# Patient Record
Sex: Female | Born: 1985 | Race: White | Hispanic: No | Marital: Married | Smoking: Former smoker
Health system: Southern US, Community
[De-identification: ages and names within clinical notes are randomized; demographics above are authoritative.]

## PROBLEM LIST (undated history)

## (undated) DIAGNOSIS — F32A Depression, unspecified: Secondary | ICD-10-CM

## (undated) DIAGNOSIS — G8929 Other chronic pain: Secondary | ICD-10-CM

## (undated) DIAGNOSIS — D649 Anemia, unspecified: Secondary | ICD-10-CM

## (undated) DIAGNOSIS — F329 Major depressive disorder, single episode, unspecified: Secondary | ICD-10-CM

## (undated) DIAGNOSIS — Z720 Tobacco use: Secondary | ICD-10-CM

## (undated) DIAGNOSIS — R102 Pelvic and perineal pain: Secondary | ICD-10-CM

## (undated) DIAGNOSIS — F419 Anxiety disorder, unspecified: Secondary | ICD-10-CM

## (undated) DIAGNOSIS — B009 Herpesviral infection, unspecified: Secondary | ICD-10-CM

## (undated) DIAGNOSIS — T7421XA Adult sexual abuse, confirmed, initial encounter: Secondary | ICD-10-CM

## (undated) DIAGNOSIS — G709 Myoneural disorder, unspecified: Secondary | ICD-10-CM

## (undated) DIAGNOSIS — R51 Headache: Secondary | ICD-10-CM

## (undated) DIAGNOSIS — I10 Essential (primary) hypertension: Secondary | ICD-10-CM

## (undated) HISTORY — DX: Tobacco use: Z72.0

## (undated) HISTORY — DX: Herpesviral infection, unspecified: B00.9

## (undated) HISTORY — DX: Other chronic pain: G89.29

## (undated) HISTORY — DX: Essential (primary) hypertension: I10

## (undated) HISTORY — DX: Pelvic and perineal pain: R10.2

## (undated) HISTORY — DX: Adult sexual abuse, confirmed, initial encounter: T74.21XA

## (undated) HISTORY — DX: Myoneural disorder, unspecified: G70.9

## (undated) HISTORY — PX: ABDOMINAL HYSTERECTOMY: SHX81

---
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
## 2006-02-13 ENCOUNTER — Emergency Department (HOSPITAL_COMMUNITY): Admission: EM | Admit: 2006-02-13 | Discharge: 2006-02-13 | Payer: Self-pay | Admitting: Emergency Medicine

## 2007-09-21 ENCOUNTER — Inpatient Hospital Stay (HOSPITAL_COMMUNITY): Admission: AD | Admit: 2007-09-21 | Discharge: 2007-09-21 | Payer: Self-pay | Admitting: Obstetrics & Gynecology

## 2007-10-26 ENCOUNTER — Ambulatory Visit (HOSPITAL_COMMUNITY): Admission: RE | Admit: 2007-10-26 | Discharge: 2007-10-26 | Payer: Self-pay | Admitting: Obstetrics & Gynecology

## 2007-11-03 ENCOUNTER — Encounter: Payer: Self-pay | Admitting: Obstetrics and Gynecology

## 2007-11-03 ENCOUNTER — Ambulatory Visit: Payer: Self-pay | Admitting: Obstetrics & Gynecology

## 2007-11-04 ENCOUNTER — Ambulatory Visit: Payer: Self-pay | Admitting: Obstetrics & Gynecology

## 2007-11-11 ENCOUNTER — Encounter: Payer: Self-pay | Admitting: Obstetrics & Gynecology

## 2007-11-11 ENCOUNTER — Ambulatory Visit: Payer: Self-pay | Admitting: Obstetrics & Gynecology

## 2007-11-11 ENCOUNTER — Inpatient Hospital Stay (HOSPITAL_COMMUNITY): Admission: RE | Admit: 2007-11-11 | Discharge: 2007-11-13 | Payer: Self-pay | Admitting: Obstetrics & Gynecology

## 2007-11-11 HISTORY — PX: EXPLORATORY LAPAROTOMY: SUR591

## 2007-11-18 ENCOUNTER — Ambulatory Visit: Payer: Self-pay | Admitting: Obstetrics & Gynecology

## 2007-12-09 ENCOUNTER — Ambulatory Visit: Payer: Self-pay | Admitting: Obstetrics & Gynecology

## 2007-12-14 ENCOUNTER — Ambulatory Visit (HOSPITAL_COMMUNITY): Admission: RE | Admit: 2007-12-14 | Discharge: 2007-12-14 | Payer: Self-pay | Admitting: Family Medicine

## 2008-01-13 ENCOUNTER — Ambulatory Visit: Payer: Self-pay | Admitting: Obstetrics & Gynecology

## 2008-02-03 ENCOUNTER — Ambulatory Visit: Payer: Self-pay | Admitting: Obstetrics & Gynecology

## 2008-02-23 ENCOUNTER — Encounter: Payer: Self-pay | Admitting: Obstetrics and Gynecology

## 2008-02-23 ENCOUNTER — Ambulatory Visit: Payer: Self-pay | Admitting: Obstetrics & Gynecology

## 2008-02-23 LAB — CONVERTED CEMR LAB
Antibody Screen: NEGATIVE
HCT: 31.6 % — ABNORMAL LOW (ref 36.0–46.0)
Hemoglobin: 10.2 g/dL — ABNORMAL LOW (ref 12.0–15.0)
RBC: 3.43 M/uL — ABNORMAL LOW (ref 3.87–5.11)

## 2008-02-29 ENCOUNTER — Encounter: Payer: Self-pay | Admitting: Obstetrics & Gynecology

## 2008-03-06 ENCOUNTER — Ambulatory Visit: Payer: Self-pay | Admitting: Obstetrics & Gynecology

## 2008-03-22 ENCOUNTER — Ambulatory Visit: Payer: Self-pay | Admitting: Obstetrics and Gynecology

## 2008-03-23 ENCOUNTER — Ambulatory Visit: Payer: Self-pay | Admitting: Family Medicine

## 2008-04-12 ENCOUNTER — Encounter: Payer: Self-pay | Admitting: Obstetrics and Gynecology

## 2008-04-12 ENCOUNTER — Ambulatory Visit: Payer: Self-pay | Admitting: Obstetrics & Gynecology

## 2008-04-12 LAB — CONVERTED CEMR LAB: GC Probe Amp, Genital: NEGATIVE

## 2008-04-13 ENCOUNTER — Encounter: Payer: Self-pay | Admitting: Obstetrics and Gynecology

## 2008-04-17 ENCOUNTER — Inpatient Hospital Stay (HOSPITAL_COMMUNITY): Admission: AD | Admit: 2008-04-17 | Discharge: 2008-04-17 | Payer: Self-pay | Admitting: Obstetrics & Gynecology

## 2008-04-17 ENCOUNTER — Ambulatory Visit: Payer: Self-pay | Admitting: Obstetrics and Gynecology

## 2008-04-19 ENCOUNTER — Ambulatory Visit (HOSPITAL_COMMUNITY): Admission: RE | Admit: 2008-04-19 | Discharge: 2008-04-19 | Payer: Self-pay | Admitting: Obstetrics & Gynecology

## 2008-04-19 ENCOUNTER — Ambulatory Visit: Payer: Self-pay | Admitting: Family Medicine

## 2008-04-26 ENCOUNTER — Ambulatory Visit: Payer: Self-pay | Admitting: Family Medicine

## 2008-05-03 ENCOUNTER — Ambulatory Visit: Payer: Self-pay | Admitting: Family Medicine

## 2008-05-08 ENCOUNTER — Encounter: Payer: Self-pay | Admitting: Obstetrics and Gynecology

## 2008-05-08 ENCOUNTER — Ambulatory Visit: Payer: Self-pay | Admitting: Family Medicine

## 2008-05-08 LAB — CONVERTED CEMR LAB
ALT: <8 units/L (ref 0–35)
Albumin: 3.4 g/dL — ABNORMAL LOW (ref 3.5–5.2)
CO2: 15 meq/L — ABNORMAL LOW (ref 19–32)
Calcium: 8.9 mg/dL (ref 8.4–10.5)
Chloride: 107 meq/L (ref 96–112)
Creatinine Clearance: 253 mL/min — ABNORMAL HIGH (ref 75–115)
Glucose, Bld: 122 mg/dL — ABNORMAL HIGH (ref 70–99)
Platelets: 337 10*3/uL (ref 150–400)
Potassium: 4.1 meq/L (ref 3.5–5.3)
RBC: 3.77 M/uL — ABNORMAL LOW (ref 3.87–5.11)
Sodium: 138 meq/L (ref 135–145)
Total Protein: 6.4 g/dL (ref 6.0–8.3)
Uric Acid, Serum: 5.7 mg/dL (ref 2.4–7.0)
WBC: 12 10*3/uL — ABNORMAL HIGH (ref 4.0–10.5)

## 2008-05-11 ENCOUNTER — Inpatient Hospital Stay (HOSPITAL_COMMUNITY): Admission: AD | Admit: 2008-05-11 | Discharge: 2008-05-14 | Payer: Self-pay | Admitting: Obstetrics & Gynecology

## 2008-05-11 ENCOUNTER — Ambulatory Visit: Payer: Self-pay | Admitting: Physician Assistant

## 2008-05-16 ENCOUNTER — Encounter: Admission: RE | Admit: 2008-05-16 | Discharge: 2008-06-15 | Payer: Self-pay | Admitting: Obstetrics & Gynecology

## 2008-05-19 ENCOUNTER — Ambulatory Visit: Admission: RE | Admit: 2008-05-19 | Discharge: 2008-05-19 | Payer: Self-pay | Admitting: Obstetrics & Gynecology

## 2008-05-22 ENCOUNTER — Emergency Department (HOSPITAL_COMMUNITY): Admission: EM | Admit: 2008-05-22 | Discharge: 2008-05-22 | Payer: Self-pay | Admitting: Family Medicine

## 2008-06-16 ENCOUNTER — Encounter: Admission: RE | Admit: 2008-06-16 | Discharge: 2008-07-13 | Payer: Self-pay | Admitting: Obstetrics & Gynecology

## 2008-06-16 ENCOUNTER — Inpatient Hospital Stay (HOSPITAL_COMMUNITY): Admission: AD | Admit: 2008-06-16 | Discharge: 2008-06-16 | Payer: Self-pay | Admitting: Obstetrics & Gynecology

## 2008-06-16 ENCOUNTER — Ambulatory Visit: Payer: Self-pay | Admitting: Family Medicine

## 2008-07-07 ENCOUNTER — Inpatient Hospital Stay (HOSPITAL_COMMUNITY): Admission: AD | Admit: 2008-07-07 | Discharge: 2008-07-07 | Payer: Self-pay | Admitting: Obstetrics & Gynecology

## 2008-07-14 ENCOUNTER — Encounter: Admission: RE | Admit: 2008-07-14 | Discharge: 2008-08-13 | Payer: Self-pay | Admitting: Obstetrics & Gynecology

## 2008-08-14 ENCOUNTER — Encounter: Admission: RE | Admit: 2008-08-14 | Discharge: 2008-09-12 | Payer: Self-pay | Admitting: Obstetrics & Gynecology

## 2008-09-13 ENCOUNTER — Encounter: Admission: RE | Admit: 2008-09-13 | Discharge: 2008-10-13 | Payer: Self-pay | Admitting: Obstetrics & Gynecology

## 2008-10-14 ENCOUNTER — Encounter: Admission: RE | Admit: 2008-10-14 | Discharge: 2008-11-12 | Payer: Self-pay | Admitting: Obstetrics & Gynecology

## 2008-11-13 ENCOUNTER — Encounter: Admission: RE | Admit: 2008-11-13 | Discharge: 2008-12-13 | Payer: Self-pay | Admitting: Obstetrics & Gynecology

## 2008-12-30 ENCOUNTER — Inpatient Hospital Stay (HOSPITAL_COMMUNITY): Admission: AD | Admit: 2008-12-30 | Discharge: 2008-12-30 | Payer: Self-pay | Admitting: Obstetrics & Gynecology

## 2009-03-19 ENCOUNTER — Inpatient Hospital Stay (HOSPITAL_COMMUNITY): Admission: AD | Admit: 2009-03-19 | Discharge: 2009-03-19 | Payer: Self-pay | Admitting: Obstetrics & Gynecology

## 2009-12-19 ENCOUNTER — Ambulatory Visit: Payer: Self-pay | Admitting: Nurse Practitioner

## 2009-12-19 ENCOUNTER — Inpatient Hospital Stay (HOSPITAL_COMMUNITY): Admission: AD | Admit: 2009-12-19 | Discharge: 2009-12-19 | Payer: Self-pay | Admitting: Obstetrics & Gynecology

## 2009-12-26 ENCOUNTER — Ambulatory Visit: Payer: Self-pay | Admitting: Nurse Practitioner

## 2009-12-26 ENCOUNTER — Inpatient Hospital Stay (HOSPITAL_COMMUNITY): Admission: AD | Admit: 2009-12-26 | Discharge: 2009-12-26 | Payer: Self-pay | Admitting: Obstetrics & Gynecology

## 2010-01-02 ENCOUNTER — Ambulatory Visit: Payer: Self-pay | Admitting: Obstetrics & Gynecology

## 2010-01-15 ENCOUNTER — Telehealth: Payer: Self-pay | Admitting: Family Medicine

## 2010-01-15 ENCOUNTER — Telehealth: Payer: Self-pay | Admitting: *Deleted

## 2010-01-30 ENCOUNTER — Encounter: Payer: Self-pay | Admitting: *Deleted

## 2010-01-30 ENCOUNTER — Ambulatory Visit: Payer: Self-pay | Admitting: Family Medicine

## 2010-01-30 ENCOUNTER — Encounter: Payer: Self-pay | Admitting: Family Medicine

## 2010-01-30 DIAGNOSIS — F4001 Agoraphobia with panic disorder: Secondary | ICD-10-CM | POA: Insufficient documentation

## 2010-01-30 DIAGNOSIS — N926 Irregular menstruation, unspecified: Secondary | ICD-10-CM | POA: Insufficient documentation

## 2010-01-31 ENCOUNTER — Encounter: Payer: Self-pay | Admitting: Family Medicine

## 2010-01-31 ENCOUNTER — Emergency Department (HOSPITAL_COMMUNITY): Admission: EM | Admit: 2010-01-31 | Discharge: 2010-01-31 | Payer: Self-pay | Admitting: Family Medicine

## 2010-01-31 ENCOUNTER — Telehealth: Payer: Self-pay | Admitting: Family Medicine

## 2010-02-04 ENCOUNTER — Encounter: Payer: Self-pay | Admitting: Family Medicine

## 2010-02-04 ENCOUNTER — Ambulatory Visit (HOSPITAL_COMMUNITY): Admission: RE | Admit: 2010-02-04 | Discharge: 2010-02-04 | Payer: Self-pay | Admitting: *Deleted

## 2010-02-04 ENCOUNTER — Ambulatory Visit: Payer: Self-pay | Admitting: Family Medicine

## 2010-02-04 ENCOUNTER — Telehealth: Payer: Self-pay | Admitting: Family Medicine

## 2010-02-04 LAB — CONVERTED CEMR LAB
AST: 13 units/L (ref 0–37)
BUN: 9 mg/dL (ref 6–23)
Bilirubin Urine: NEGATIVE
CO2: 24 meq/L (ref 19–32)
Calcium: 9.9 mg/dL (ref 8.4–10.5)
Chloride: 107 meq/L (ref 96–112)
Creatinine, Ser: 0.71 mg/dL (ref 0.40–1.20)
Eosinophils Relative: 2 % (ref 0–5)
HCT: 35.9 % — ABNORMAL LOW (ref 36.0–46.0)
Hemoglobin: 11.6 g/dL — ABNORMAL LOW (ref 12.0–15.0)
Ketones, urine, test strip: NEGATIVE
Lymphocytes Relative: 35 % (ref 12–46)
Lymphs Abs: 2.7 10*3/uL (ref 0.7–4.0)
Monocytes Absolute: 0.3 10*3/uL (ref 0.1–1.0)
Protein, U semiquant: NEGATIVE
Urobilinogen, UA: 0.2
WBC: 7.5 10*3/uL (ref 4.0–10.5)

## 2010-02-11 ENCOUNTER — Ambulatory Visit: Payer: Self-pay | Admitting: Family Medicine

## 2010-02-11 ENCOUNTER — Encounter: Payer: Self-pay | Admitting: *Deleted

## 2010-02-11 ENCOUNTER — Telehealth: Payer: Self-pay | Admitting: *Deleted

## 2010-02-13 ENCOUNTER — Encounter: Payer: Self-pay | Admitting: Family Medicine

## 2010-02-13 ENCOUNTER — Ambulatory Visit: Payer: Self-pay | Admitting: Family Medicine

## 2010-02-13 DIAGNOSIS — F321 Major depressive disorder, single episode, moderate: Secondary | ICD-10-CM | POA: Insufficient documentation

## 2010-02-25 ENCOUNTER — Telehealth: Payer: Self-pay | Admitting: Family Medicine

## 2010-02-28 ENCOUNTER — Emergency Department (HOSPITAL_COMMUNITY): Admission: EM | Admit: 2010-02-28 | Discharge: 2010-02-28 | Payer: Self-pay | Admitting: Emergency Medicine

## 2010-03-02 ENCOUNTER — Ambulatory Visit: Payer: Self-pay | Admitting: Nurse Practitioner

## 2010-03-02 ENCOUNTER — Inpatient Hospital Stay (HOSPITAL_COMMUNITY): Admission: AD | Admit: 2010-03-02 | Discharge: 2010-03-03 | Payer: Self-pay | Admitting: Family Medicine

## 2010-03-06 ENCOUNTER — Telehealth: Payer: Self-pay | Admitting: Family Medicine

## 2010-03-07 ENCOUNTER — Ambulatory Visit: Payer: Self-pay | Admitting: Family Medicine

## 2010-03-07 ENCOUNTER — Encounter: Payer: Self-pay | Admitting: Family Medicine

## 2010-03-19 ENCOUNTER — Telehealth: Payer: Self-pay | Admitting: Family Medicine

## 2010-03-20 ENCOUNTER — Telehealth: Payer: Self-pay | Admitting: Family Medicine

## 2010-03-20 ENCOUNTER — Inpatient Hospital Stay (HOSPITAL_COMMUNITY)
Admission: AD | Admit: 2010-03-20 | Discharge: 2010-03-20 | Payer: Self-pay | Source: Home / Self Care | Admitting: Family Medicine

## 2010-03-21 ENCOUNTER — Encounter: Payer: Self-pay | Admitting: *Deleted

## 2010-03-28 ENCOUNTER — Ambulatory Visit: Payer: Self-pay | Admitting: Family Medicine

## 2010-03-28 DIAGNOSIS — I10 Essential (primary) hypertension: Secondary | ICD-10-CM | POA: Insufficient documentation

## 2010-04-02 ENCOUNTER — Encounter: Payer: Self-pay | Admitting: Family Medicine

## 2010-04-03 ENCOUNTER — Ambulatory Visit: Payer: Self-pay | Admitting: Obstetrics and Gynecology

## 2010-04-07 ENCOUNTER — Emergency Department (HOSPITAL_BASED_OUTPATIENT_CLINIC_OR_DEPARTMENT_OTHER)
Admission: EM | Admit: 2010-04-07 | Discharge: 2010-04-07 | Payer: Self-pay | Source: Home / Self Care | Admitting: Emergency Medicine

## 2010-04-08 ENCOUNTER — Emergency Department (HOSPITAL_BASED_OUTPATIENT_CLINIC_OR_DEPARTMENT_OTHER)
Admission: EM | Admit: 2010-04-08 | Discharge: 2010-04-08 | Payer: Self-pay | Source: Home / Self Care | Admitting: Emergency Medicine

## 2010-04-16 ENCOUNTER — Telehealth: Payer: Self-pay | Admitting: Family Medicine

## 2010-04-18 ENCOUNTER — Encounter: Payer: Self-pay | Admitting: *Deleted

## 2010-04-18 ENCOUNTER — Ambulatory Visit: Payer: Self-pay | Admitting: Obstetrics and Gynecology

## 2010-04-18 ENCOUNTER — Ambulatory Visit: Payer: Self-pay | Admitting: Family Medicine

## 2010-04-24 ENCOUNTER — Emergency Department (HOSPITAL_COMMUNITY)
Admission: EM | Admit: 2010-04-24 | Discharge: 2010-04-25 | Payer: Self-pay | Source: Home / Self Care | Admitting: Emergency Medicine

## 2010-04-24 LAB — URINALYSIS, ROUTINE W REFLEX MICROSCOPIC
Bilirubin Urine: NEGATIVE
Hemoglobin, Urine: NEGATIVE
Ketones, ur: NEGATIVE mg/dL
Nitrite: NEGATIVE
Protein, ur: NEGATIVE mg/dL
Specific Gravity, Urine: 1.028 (ref 1.005–1.030)
Urine Glucose, Fasting: NEGATIVE mg/dL
Urobilinogen, UA: 0.2 mg/dL (ref 0.0–1.0)
pH: 6.5 (ref 5.0–8.0)

## 2010-04-24 LAB — POCT PREGNANCY, URINE: Preg Test, Ur: NEGATIVE

## 2010-05-01 ENCOUNTER — Ambulatory Visit (HOSPITAL_COMMUNITY)
Admission: RE | Admit: 2010-05-01 | Discharge: 2010-05-01 | Payer: Self-pay | Source: Home / Self Care | Attending: Family Medicine | Admitting: Family Medicine

## 2010-05-02 ENCOUNTER — Ambulatory Visit
Admission: RE | Admit: 2010-05-02 | Discharge: 2010-05-02 | Payer: Self-pay | Source: Home / Self Care | Attending: Obstetrics and Gynecology | Admitting: Obstetrics and Gynecology

## 2010-05-07 ENCOUNTER — Telehealth: Payer: Self-pay | Admitting: Family Medicine

## 2010-05-09 ENCOUNTER — Emergency Department (HOSPITAL_BASED_OUTPATIENT_CLINIC_OR_DEPARTMENT_OTHER)
Admission: EM | Admit: 2010-05-09 | Discharge: 2010-05-09 | Payer: Self-pay | Source: Home / Self Care | Admitting: Emergency Medicine

## 2010-05-12 ENCOUNTER — Encounter: Payer: Self-pay | Admitting: Family Medicine

## 2010-05-12 ENCOUNTER — Encounter: Payer: Self-pay | Admitting: *Deleted

## 2010-05-13 LAB — URINALYSIS, ROUTINE W REFLEX MICROSCOPIC
Bilirubin Urine: NEGATIVE
Hgb urine dipstick: NEGATIVE
Ketones, ur: NEGATIVE mg/dL
Nitrite: NEGATIVE
Protein, ur: 30 mg/dL — AB
Specific Gravity, Urine: 1.024 (ref 1.005–1.030)
Urine Glucose, Fasting: NEGATIVE mg/dL
Urobilinogen, UA: 1 mg/dL (ref 0.0–1.0)
pH: 7.5 (ref 5.0–8.0)

## 2010-05-13 LAB — URINE CULTURE
Colony Count: 2000
Culture  Setup Time: 201201192205

## 2010-05-13 LAB — PREGNANCY, URINE: Preg Test, Ur: NEGATIVE

## 2010-05-13 LAB — URINE MICROSCOPIC-ADD ON

## 2010-05-14 LAB — CBC
HCT: 39.9 % (ref 36.0–46.0)
Hemoglobin: 12.8 g/dL (ref 12.0–15.0)
MCH: 26.3 pg (ref 26.0–34.0)
MCHC: 32.1 g/dL (ref 30.0–36.0)
MCV: 82.1 fL (ref 78.0–100.0)
Platelets: 371 10*3/uL (ref 150–400)
RBC: 4.86 MIL/uL (ref 3.87–5.11)
RDW: 15.5 % (ref 11.5–15.5)
WBC: 10.3 10*3/uL (ref 4.0–10.5)

## 2010-05-14 LAB — BASIC METABOLIC PANEL
BUN: 6 mg/dL (ref 6–23)
CO2: 25 mEq/L (ref 19–32)
Calcium: 9.7 mg/dL (ref 8.4–10.5)
Chloride: 102 mEq/L (ref 96–112)
Creatinine, Ser: 0.63 mg/dL (ref 0.4–1.2)
GFR calc Af Amer: >60 mL/min (ref >60)
GFR calc non Af Amer: >60 mL/min (ref >60)
Glucose, Bld: 99 mg/dL (ref 70–99)
Potassium: 3.6 mEq/L (ref 3.5–5.1)
Sodium: 135 mEq/L (ref 135–145)

## 2010-05-14 LAB — SURGICAL PCR SCREEN
MRSA, PCR: NEGATIVE
Staphylococcus aureus: NEGATIVE

## 2010-05-15 ENCOUNTER — Inpatient Hospital Stay (HOSPITAL_COMMUNITY)
Admission: RE | Admit: 2010-05-15 | Discharge: 2010-05-17 | Payer: Self-pay | Source: Home / Self Care | Attending: Obstetrics & Gynecology | Admitting: Obstetrics & Gynecology

## 2010-05-15 LAB — TYPE AND SCREEN: ABO/RH(D): A NEG

## 2010-05-15 LAB — PREGNANCY, URINE: Preg Test, Ur: NEGATIVE

## 2010-05-16 HISTORY — PX: DIAGNOSTIC LAPAROSCOPY: SUR761

## 2010-05-16 LAB — CBC
HCT: 31.9 % — ABNORMAL LOW (ref 36.0–46.0)
MCV: 82 fL (ref 78.0–100.0)
Platelets: 316 10*3/uL (ref 150–400)
RBC: 3.89 MIL/uL (ref 3.87–5.11)
RDW: 15.4 % (ref 11.5–15.5)
WBC: 13.5 10*3/uL — ABNORMAL HIGH (ref 4.0–10.5)

## 2010-05-20 NOTE — Discharge Summary (Signed)
NAMEJIA, DOTTAVIO              ACCOUNT NO.:  [id number]  MEDICAL RECORD NO.:  [id number]          PATIENT TYPE:  INP  LOCATION:  9303                          FACILITY:  [facility]  PHYSICIAN:  Horton Chin, MD DATE OF BIRTH:  10-27-1985  DATE OF ADMISSION:  05/15/2010 DATE OF DISCHARGE:  05/17/2010                              DISCHARGE SUMMARY   ADMISSION DIAGNOSES:  Chronic pelvic pain, persistent right ovarian cyst, endometriosis.  DISCHARGE DIAGNOSES:  Chronic pelvic pain, persistent right ovarian cyst, endometriosis.  PROCEDURES:  Status post exploratory laparotomy, right oophorectomy, and lysis of adhesions for endometriosis.  PERTINENT STUDIES:  Preoperative hemoglobin of 12.8, postoperative hemoglobin of 10.4.  Pathology consistent with endometriosis (endometriotic cyst of the ovary).  BRIEF HOSPITAL COURSE:  The patient is a 25 year old gravida 1, para 1, with a long history of chronic pelvic pain.  In 2009, patient underwent removal of a 9-cm endometrioma during pregnancy and required an exploratory laparotomy for this.  Since that surgery, patient has had persistent recurrence of endometrioma on the right side causing significant pain and requiring significant narcotic use.  The patient desired definitive surgery for her right ovary.  Prior to surgery, she was also counseled that she will need Depo Lupron for treatment which she has never tried.  She is currently on Sprintec which is not helping her symptoms much.  The patient underwent an uncomplicated surgery.  For further details of this operation, please refer to separate dictated operative report.  Postoperatively, pain control was a problem and patient required escalating doses of narcotics and did not seem to get comfortable initially, but after she was transitioned to oral pain meds, she was stable.  By postoperative day #2, she was ambulating, voiding without difficulty, tolerating regular diet, and  passing flatus.  She was deemed stable for discharge to home.  DISCHARGE MEDICATIONS: 1. Percocet 10 /325 one to two tablets p.o. q.4-6 hours p.r.n. severe     pain. 2. Ibuprofen 600 mg p.o. q.6 hours p.r.n. pain. 3. Colace 100 mg p.o. b.i.d. p.r.n. constipation.  DISCHARGE INSTRUCTIONS:  The patient was given routine discharge instructions for postoperative patients and told to avoid heavy lifting or sitting in bath of water.  She was told to keep the incision clean and dry and to come to the MAU for any concerns.  Her followup appointment has been scheduled in the [facility] on June 21, 2010, at 9:30 a.m., and this was communicated to the patient.  The patient does say that she might be traveling to [place] within the next two weeks to be with her family.  It was emphasized that  prolonged travel was not recommended.  However, if she made the decision  to travel, she should take frequent breaks for leg exercises as  she is at high risk for deep venous thrombosis or pulmonary embolism  given that she just had pelvic surgery. She was told that if she has  any problems, to go to the nearest emergency room for evaluation.     Horton Chin, MD     UAA/MEDQ  D:  05/17/2010  T:  05/18/2010  Job:  [id number]  Electronically Signed by Jaynie Collins MD on 05/20/2010 06:20:33 PM

## 2010-05-20 NOTE — Op Note (Signed)
NAMELUCIANNE, Mcdaniel              ACCOUNT NO.:  [id number]  MEDICAL RECORD NO.:  [id number]          PATIENT TYPE:  INP  LOCATION:  9303                          [facility]  PHYSICIAN:  Horton Chin, MD DATE OF BIRTH:  [birth year]  DATE OF PROCEDURE:  05/15/2010 DATE OF DISCHARGE:                              OPERATIVE REPORT   PREOPERATIVE DIAGNOSES:  Chronic pelvic pain, persistent right ovarian cyst.  POSTOPERATIVE DIAGNOSES:  Chronic pelvic pain, persistent right ovarian cyst and extensive endometriosis.  PROCEDURES:  Diagnostic laparoscopy, conversion to exploratory laparotomy, right oophorectomy, lysis of adhesions and fulguration of endometriosis.  SURGEON:  Horton Chin, MD  ANESTHESIA:  General.  ANESTHESIOLOGIST:  Brayton Caves, MD  IV FLUIDS:  2200 mL of lactated Ringer's.  ESTIMATED BLOOD LOSS:  100 mL.  URINE OUTPUT:  150 mL.  INDICATIONS:  The patient is a 25 year old, gravida 1, para 1, with a history of undergoing exploratory laparotomy during pregnancy for a large right endometrioma in 2009.  Since then, the patient has had significant pelvic pain and persistent right-sided pain.  The patient is also requiring large doses of narcotics to help manage her pain.  At her last encounter in the office, she was counseled regarding management and the patient has been taking Sprintec continually, but she said that this does not help with her pain.  The patient wanted surgical management and removal of the right ovary.  On evaluation of her last operative note, it was noted that her right ovary was pretty adherent to her pelvic sidewall and posterior uterus, but the patient was consented for diagnostic laparoscopy, possible laparotomy and also removal of her right ovary and fulguration of endometriosis.  Prior to surgery, the risks of the surgery were reviewed with the patient including, but not limited to: bleeding, infection, injury to  surrounding organs, need for additional procedures, possible conversion to laparotomy and written informed consent was obtained.  Of note, the patient was told that if there was endometriosis that the definitive surgery was a total abdominal hysterectomy and bilateral salpingo-oophorectomy.  However, the patient desires future fertility and did not want definitive surgical management as of now.  FINDINGS:  Dense adhesions of her right ovary to her pelvic sidewall and posterior uterus.  The patient has a small uterus.  There was also dense adhesions in the posterior cul-de-sac.  Normal left adnexa.  She did have one lesion of endometriosis that was seen in the anterior cul-de- sac.  SPECIMENS:  Right ovary.  DISPOSITION OF SPECIMEN:  Pathology.  COMPLICATIONS:  None immediate.  PROCEDURE DETAILS:  The patient received preoperative antibiotics and had sequential compression devices applied to her lower extremities. While in the preoperative area, she was then taken to the operating room where general analgesia was administered and found to be adequate.  The patient was then placed in the dorsal lithotomy position, and prepped and draped in a sterile manner.  A Foley catheter was inserted in the patient's bladder and attached to constant gravity.  A uterine manipulator was also placed at this point.  After an adequate time-out was performed, attention was turned  to the patient's umbilicus where an incision was made and the Veress needle was introduced into the peritoneal cavity and used for insufflation of the peritoneal cavity with carbon dioxide gas.  This was done up to 15 mmHg.  The Veress needle was removed and 10-mm trocar was then placed.  Through this trocar, laparoscope was placed and used to survey the abdomen and pelvis.  It was clearly noted that the right ovary was densely adherent to the pelvic sidewall.  An attempt was made to loosen the adhesions in a blunt fashion  using the blunt probe, but this was not possible and there was also significant adhesions seen in the posterior cul-de-sac involving the uterus and some part of the intestines.  Given the difficulty with doing this laparoscopically, decision was made to abort the procedure and convert to the laparotomy.  The abdomen was desufflated.  All instruments were removed and the umbilical incision was repaired with 0 Vicryl for the fascia and 4-0 Vicryl subcuticular stitch for the skin incision.  Prior to the conversion to laparotomy,  the patient was taken out of dorsal lithotomy position and placed in  the dorsal supine position. Attention was then turned to the patient's  lower abdomen, where an incision was made over her preexisting scar and taken down to the layer of fascia.  The fascia was incised in the midline and extended bilaterally using scissors.  Kochers were applied to both aspects of the fascial incision and the underlying  rectus muscles were dissected off bluntly and sharply.  The patient was noted to have a separation of her rectus muscles and so access was obtained into the peritoneal cavity.  The peritoneum was incised superiorly and inferiorly with good visualization of bowel and bladder.  At this point, the bowel was packed away with laparotomy sponges.  After the bowel was packed away, attention was then turned  to the patient's right adnexal region, where blunt dissection, and sharp dissection was used to free the ovary from the pelvic sidewall and the posterior uterus.  At this point, the large cyst that was noted was ruptured for dark chocolate fluid that was copious and the infundibulopelvic ligament was identified, clamped, cut and suture ligated.  The ovary was freed from the fallopian tube using coagulation and the gyrus instrument and then the utero-ovarian ligament was able to be clamped, cut and suture ligated allowing for a right oophorectomy. There was some  bleeders especially around the base of the adhesions that were coagulated and some endometriosis lesions that were seenin the posterior cul-de-sac that were fulgurated.  The rest of adhesions to the bowel was carefully lysed and also endometriosis lesions in the anterior cul-de-sac were also fulgurated.  The abdomen and pelvis was copiously irrigated and then because there was this punctate bleeding from the base of the adhesions, Surgicel was placed around this friable area.   All the instruments were then removed from the patient's pelvis.   The peritoneum and muscle layer were closed in a mass fashion using a 0 Vicryl running stitch.  The fascia was closed using looped 0 PDS.  The subcutaneous layer was irrigated.  Local analgesia was administered in the form of 0.25% Marcaine, total of 30 mL was used and then the incision was closed with 3-0 Monocryl subcuticular stitch.  The patient tolerated the procedure well.  Sponge, instrument and needle  counts were correct x2.  She was taken to recovery room in stable  condition.  Of note, the  patient will be counseled regarding probably  being on Depo-Lupron before she tries to get pregnant again and the  results of the pathology will be followed up and managed accordingly.     Horton Chin, MD     UAA/MEDQ  D:  05/15/2010  T:  05/16/2010  Job:  [id number]  Electronically Signed by Jaynie Collins MD on 05/20/2010 06:17:40 PM

## 2010-05-21 NOTE — Letter (Signed)
Summary: Work Excuse  [facility]  [address]   [place], [place] [address]   Phone: [phone]  Fax: [phone]    Today's Date: January 30, 2010  Name of Patient: Rachel Mcdaniel  The above named patient had a medical visit today at: 3:45 pm.  Please take this into consideration when reviewing the time away from work.    Special Instructions:  [ x ] None  [  ] To be off the remainder of today, returning to the normal work / school schedule tomorrow.  [  ] To be off until the next scheduled appointment on ______________________.  [  ] Other ________________________________________________________________ ________________________________________________________________________   Sincerely yours,   Loralee Pacas CMA

## 2010-05-21 NOTE — Assessment & Plan Note (Signed)
Summary: remove iud/eo   Vital Signs:  Patient profile:   25 year old female Height:      64.5 inches Weight:      154 pounds BMI:     26.12 Temp:     98.3 degrees F oral Pulse rate:   69 / minute BP sitting:   140 / 94  (left arm) Cuff size:   regular  Vitals Entered By: Jimmy Footman, CMA (February 13, 2010 2:51 PM) CC: iud removal Is Patient Diabetic? No Pain Assessment Patient in pain? no        Primary Care Provider:  Clementeen Graham MD  CC:  iud removal.  History of Present Illness: Pt returns to clinic today to follow up her abdominal pain and to remove IUD.   Abdominal pain:  Conmtinues to be chronic. Taking occasional vicoprofen for pain. Was nauseated with vicodin and  threw them away. Trigger point injection helped some but has rebound pain following injection.  Unable to fill zofran or phenergan due to costs at [facility].   Bleeding: Irrgular vaginal bleeding. Has been continuous for 4 weeks. Has copper IUD in place.  OK with taking OCPs and using condoms for birth control.   Mood: Has started taking celexa. Feels somewhat improved. PHQ9 today is 12. Will scan in. No SI/HI. Also feels anxiety component to mood.   Habits & Providers  Alcohol-Tobacco-Diet     Alcohol drinks/day: 0     Tobacco Status: quit  Exercise-Depression-Behavior     Have you felt down or hopeless? yes     Have you felt little pleasure in things? 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 1 By Mouth Daily 2)  Zofran 4 Mg Tabs (Ondansetron Hcl) .Marland Kitchen.. 1 Tablet Every 6-8 Hours As Needed For Nausea 3)  Sprintec 28 0.25-35 Mg-Mcg Tabs (Norgestimate-Eth Estradiol) .Marland Kitchen.. 1 Tab By Mouth Daily As Instructed On Pack 4)   Promethazine Hcl 12.5 Mg Tabs (Promethazine Hcl) .Marland Kitchen.. 1-2 Tabs  Every 4-6 Hours As Needed For Nausea 5)  Hydrocodone-Acetaminophen 5-500 Mg Tabs (Hydrocodone-Acetaminophen) .... 1/2 To 1 6 Hours As Needed For Pain  Allergies (verified): No Known Drug Allergies  Past History:  Past Surgical History: Last updated: 01/30/2010 Hx ovarian cyst removed 9cm via open procedure.  G1P1  Past Medical History: Panic Disorder vs depression.  Abdominal Pain Menometrorrhagia.  Social History: Reviewed history from 01/30/2010 and no changes required. Lives in [place] with her daughter and husband.  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  Review of Systems       The patient complains of abdominal pain.  The patient denies anorexia, fever, weight loss, chest pain, syncope, dyspnea on exertion, melena, hematochezia, severe indigestion/heartburn, genital sores, suspicious skin lesions, difficulty walking, enlarged lymph nodes, and breast masses.    Physical Exam  General:  VS noted.  Well woman in NAD Head:  normocephalic and atraumatic.   Eyes:  vision grossly intact.   Ears:  no external deformities.   Nose:  no external deformity.   Mouth:  good dentition.   Lungs:  normal respiratory effort.   Heart:  normal rate, regular rhythm, and no murmur.   Abdomen:  + tenderness to palpation in RLQ, minimal rebound/guarding,  +  bowel sounds no distention, no masses, no rigidity, no abdominal hernia, no inguinal hernia, no hepatomegaly, and no splenomegaly.   Genitalia:  Normal introitus for age, no external lesions, no vaginal discharge, mucosa pink and moist, no vaginal or cervical lesions, no vaginal atrophy, no friaility or hemorrhage, normal uterus size and position, no adnexal masses or tenderness. IUD strings noted in external oss.   Extremities:  2+ peripheral pulses Cervical Nodes:  No lymphadenopathy noted Axillary Nodes:  No palpable lymphadenopathy Psych:  Oriented X3, memory  intact for recent and remote, normally interactive, good eye contact, not depressed appearing, not agitated, not suicidal, not homicidal, and slightly anxious.   Additional Exam:  Procedure note: IUD removal. Concent obtained. Timeout performed.  Specelum placed and cervix visualized with strings emmerging.  Cervix swabbed x3 with betadine. Using sterile ringed forceps and sterile technique an intact copper IUD was removed from the uterus without difficulty and complications. No bleeeding. Pt tolerated the procedure well.    Impression & Recommendations:  Problem # 1:  ABDOMINAL PAIN, RIGHT LOWER QUADRANT (ICD-789.03) Assessment Improved  This is becoming a chronic pain issue with a poor physiologic explination. Possible explination is hemmorhagic  or endometroid cyst vs abdominal wall pain.  However IUD may be cause of some crampy pain. Plan: Remove IUD today, and provide OCPs for cyst control and bleeding.  Also refil x1 vicodin. Explained to patient that if she is intolerant to a pain medication to bring it in and show to nurses to confirm pill count.  Goal is for me no to be providing long term pain control.   Orders: [facility]- Est  Level 4 (14782) IUD removal -[facility] (95621)  Problem # 2:  IRREGULAR MENSTRUAL CYCLE (ICD-626.4) Assessment: New  See above.  Her updated medication list for this problem includes:    Sprintec 28 0.25-35 Mg-mcg Tabs (Norgestimate-eth estradiol) .Marland Kitchen... 1 tab by mouth daily as instructed on pack  Orders: [facility]- Est  Level 4 (30865) IUD removal -[facility] (78469)  Problem # 3:  DEPRESSION, MAJOR, MODERATE (ICD-296.22)  On celexa 10mg . Tolerating well. Plan to follow up in 1 month to reasses.   Orders: [facility]- Est  Level 4 (99214)  Problem # 4:  PANIC DISORDER WITH AGORAPHOBIA (ICD-300.21) Same as above.   Complete Medication List: 1)  Citalopram Hydrobromide 10 Mg Tabs (Citalopram hydrobromide) .Marland Kitchen.. 1 by mouth daily 2)  Zofran 4 Mg Tabs (Ondansetron hcl) .Marland Kitchen.. 1  tablet every 6-8 hours as needed for nausea 3)  Sprintec 28 0.25-35 Mg-mcg Tabs (Norgestimate-eth estradiol) .Marland Kitchen.. 1 tab by mouth daily as instructed on pack 4)  Promethazine Hcl 12.5 Mg Tabs (Promethazine hcl) .Marland Kitchen.. 1-2 tabs  every 4-6 hours as needed for nausea 5)  Hydrocodone-acetaminophen 5-500 Mg Tabs (Hydrocodone-acetaminophen) .... 1/2 to 1 6 hours as needed for pain  Patient Instructions: 1)  Thank you for seeing me today. 2)  Try the celexa in the morning. 3)  Use condoms with sex. 4)  You will have some bleeding following IUD removal.  5)  Sprintec is a birth control pill. It should control bleeding and control cysts.  6)  Come back in 1 month.  7)  1/2 vicodin tablet for pain will cause less nausea.  8)  You can try the sprintec for 3 months in a row then take the placebo part of it to have a peroid.  Prescriptions: HYDROCODONE-ACETAMINOPHEN 5-500 MG TABS (HYDROCODONE-ACETAMINOPHEN) 1/2 to 1 6 hours as needed for pain  #30 x 0  Entered and Authorized by:   Clementeen Graham MD   Signed by:   Clementeen Graham MD on 02/13/2010   Method used:   Print then Give to Patient   RxID:   [id number] SPRINTEC 28 0.25-35 MG-MCG TABS (NORGESTIMATE-ETH ESTRADIOL) 1 tab by mouth daily as instructed on pack  #30 x 11   Entered and Authorized by:   Clementeen Graham MD   Signed by:   Clementeen Graham MD on 02/13/2010   Method used:   Electronically to        [facility] [address] (retail)       [phone]       [place], [place]  [address]       Ph: [phone]       Fax: [phone]   RxID:   [id number] PROMETHAZINE HCL 12.5 MG TABS (PROMETHAZINE HCL) 1-2 tabs  every 4-6 hours as needed for nausea  #30 x 0   Entered and Authorized by:   Clementeen Graham MD   Signed by:   Clementeen Graham MD on 02/13/2010   Method used:   Electronically to        [facility] [address] (retail)       [phone]       [place], [place]  [address]       Ph:  [phone]       Fax: [phone]   RxID:   [id number] ZOFRAN 4 MG TABS (ONDANSETRON HCL) 1 tablet every 6-8 hours as needed for nausea  #30 x 0   Entered and Authorized by:   Clementeen Graham MD   Signed by:   Clementeen Graham MD on 02/13/2010   Method used:   Electronically to        [facility] [address] (retail)       [phone]       [place], [place]  [address]       Ph: [phone]       Fax: [phone]   RxID:   [id number]    Orders Added: 1)  [facility]- Est  Level 4 [93818] 2)  IUD removal -[facility] [[id number]]

## 2010-05-21 NOTE — Progress Notes (Signed)
Summary: phn msg  Phone Note Call from Patient Call back at [facility] Phone [phone]   Caller: Patient Summary of Call: pt is needing to talk to Dr Denyse Amass Initial call taken by: De Nurse,  February 25, 2010 1:39 PM  Follow-up for Phone Call        Called and left a message.  Pt needs MD visit for refils on this. Said as much.  Follow-up by: Clementeen Graham MD,  February 25, 2010 2:25 PM

## 2010-05-21 NOTE — Progress Notes (Signed)
Summary: triage  Phone Note Call from Patient Call back at Home Phone [phone]   Caller: Patient Summary of Call: Pt having severe abd pain. Initial call taken by: Clydell Hakim,  January 15, 2010 4:31 PM  Follow-up for Phone Call        states she is bleeding very badly-a tampon every 20 minutes since last night.  pain is 9/10.  taking naproxyn & megace.  has no insurance. told her anywhre she goes will want at least $25, including [place]. she was on her feet 12 hours at work yesterday & has to go in at 5 today.  I advised her to stay home & off feet & go to [facility] for the note she needs for work. just wants a rx. told her since it has been years since she has been here, she will need to apply to be a new pt. states she has an appt in later Oct. told her she will have to use UC until then Follow-up by: Golden Circle RN,  January 15, 2010 4:39 PM

## 2010-05-21 NOTE — Progress Notes (Signed)
Summary: meds  Phone Note Call from Patient Call back at Home Phone [phone]   Caller: Patient Summary of Call: pt is hurting really bad and wants to know if she can get pain meds until appt w/ Denyse Amass [facility] [facility]  Initial call taken by: De Nurse,  February 04, 2010 12:01 PM  Follow-up for Phone Call        called pt back and was given appt for this afternoon Follow-up by: De Nurse,  February 04, 2010 12:13 PM

## 2010-05-21 NOTE — Miscellaneous (Signed)
Summary: reax to shot per pt  Clinical Lists Changes pt called crying & in pain. states the shot she got yesterday has made her legs feel very heavy & she has a HA. taking naproxyn which is not helping. referred her to UC as we have no appts. I tected md to call me. he states he gave her a trigger point injection in pelvic area by c section scar.it was Lidocaine in muscle. 5/8 inch needle. states she has some anxiety issues. he got her number & stated he was going to call her now.Golden Circle RN  January 31, 2010 12:00 PM  md called back & sent her to UC. she is also vomiting.Golden Circle RN  January 31, 2010 12:02 PM  MD Note:  Also pt was started on Celexa yesterday. The GI effects and headache may be due to this medication as well. Called patient and left a message to stop this medication and follow up at PCP office soon.  Pt asked to go to urgent care for exam to evaluate for peritinitis. Feel this is not likely but warents speedy evalauation.   Clementeen Graham, MD [phone]

## 2010-05-21 NOTE — Assessment & Plan Note (Signed)
Summary: np,df   Vital Signs:  Patient profile:   25 year old female Height:      64.5 inches Weight:      157 pounds BMI:     26.63 Temp:     98.2 degrees F oral BP sitting:   136 / 68  (left arm) Cuff size:   regular  Vitals Entered By: Tessie Fass CMA (January 30, 2010 4:10 PM) CC: NEW PT Is Patient Diabetic? No Pain Assessment Patient in pain? yes     Location: abdomen Intensity: 8   CC:  NEW PT.  History of Present Illness: Ms Mennella presents to clinic today to establish care and to follow up abominal pain and anxiety.  Abdominal pain.  For around 2 years. Had pain following a laporotomy for cyst removal during pregnancy.  Continues to have LRQ abdominal pain esecially with lifting and sitting up.  This issue has been worked up at [facility] clinic.  She has had a subesquent normal pelvic ultrasound in the interum. She does have a copper IUD and does have irregular bleeding. She is planning on removing the IUD. Bleeding now x4 weeks. Passing some blood clots. Cramping. No GI symptoms.  Anxiety: For years. Has panic attacks several times a month. Can last for a few minutes. Several years ago she was treated with Xanax. She was tried on Zoloft but had headaches and felt foggy. She is currently on no treatment. She states that her fear of a panic attack prevents her from doing things and going places.     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 1 By Mouth Daily  Allergies (verified): No Known Drug Allergies  Past History:  Past Medical History: Panic Disorder Abdominal Pain  Past Surgical History: Hx ovarian cyst removed 9cm via open procedure.    G1P1  Family History: Stroke in parents.   Social History: Lives in [place] with her daughter and husband.  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Smoking Status:  quit  Review of Systems       The patient complains of abdominal pain and abnormal bleeding.  The patient denies anorexia, fever, weight loss, chest pain, syncope, prolonged cough, melena, hematochezia, severe indigestion/heartburn, genital sores, depression, unusual weight change, and enlarged lymph nodes.    Physical Exam  General:  VS noted.  Well woman in NAD Mouth:  mmm Lungs:  Normal respiratory effort, chest expands symmetrically. Lungs are clear to auscultation, no crackles or wheezes. Heart:  Normal rate and regular rhythm. S1 and S2 normal without gallop, murmur, click, rub or other extra sounds. Abdomen:  soft, normal bowel sounds, no distention, no masses, no rebound tenderness, no abdominal hernia, and no inguinal hernia.   Tender at insertion of the femoral rectus sheath on the right side.  Pain worse when in crunch positiion with palpation.  (Carnetts sign positive) Extremities:  No clubbing, cyanosis, edema, or deformity noted  Cervical Nodes:  No lymphadenopathy noted Axillary Nodes:  No palpable lymphadenopathy Psych:  Oriented X3, memory intact for recent and remote, normally interactive, good eye contact, not depressed appearing, not agitated, not suicidal, not homicidal, and slightly anxious.   Additional Exam:  Procedure note: Trigger point injection. Concent obtained and time out performed.  After applying betadine to abdominal wall. 3ml of 2% lidocaine was injected in the area of maximal tenderness in the diastal rectus femoral sheath in the abdominal wall.  Used a 25 gauage 5/8 inch needle. No blood return on pull back. Patient tolerated the precedure well. Pain much reduced following the injection.  No bleeding.    Impression & Recommendations:  Problem # 1:  ABDOMINAL WALL PAIN  (ICD-789.09) Assessment New  Think this pain is due to muscle pain in the abdominal wall.  Given that the Carnett's sign is positive and pain was much resued following trigger point injection.  Gave home PT exercises. Will follow up in 2-4 weeks or earlier as needed. Red flags reviewed.   Orders: [facility]- Est  Level 4 ([address]403) Trigger point injection- [facility] (47425)  Problem # 2:  PANIC DISORDER WITH AGORAPHOBIA (ICD-300.21) Assessment: New  Think this is panic disorder with an agrophobia component due to panic attacks and fear of going outside or in large groups.  Did not tolerate zoloft in the past.  Plan to start low dose celexa 10mg  and follow up in 4 weeks.   Orders: [facility]- Est  Level 4 (95638)  Problem # 3:  IRREGULAR MENSTRUAL CYCLE (ICD-626.4)  Currently bleeding.  Having cramps and passing blood clots.  Think due to copper IUD.  Plan to have patient come back for IUD removal.  Will follow up.   Orders: [facility]- Est  Level 4 (99214)  Complete Medication List: 1)  Citalopram Hydrobromide 10 Mg Tabs (Citalopram hydrobromide) .Marland Kitchen.. 1 by mouth daily  Patient Instructions: 1)  Thank you for seeing me today. 2)  Try to stretch that area. It is called the insertion on the rectus sheath.   3)  Start taking the celexa. If it makes you sleepy take it at night. If it makes you awake take it in the morning.  4)  Follow  2-4 weeks.  Prescriptions: CITALOPRAM HYDROBROMIDE 10 MG TABS (CITALOPRAM HYDROBROMIDE) 1 by mouth daily  #30 x 3   Entered and Authorized by:   Clementeen Graham MD   Signed by:   Clementeen Graham MD on 01/30/2010   Method used:   Electronically to        [id number] (retail)       [address]       [place], [place]  [address]       Ph: [phone] or [phone]       Fax: [phone]   RxID:   [id number]

## 2010-05-21 NOTE — Progress Notes (Signed)
Summary: refill  Phone Note Refill Request Call back at Home Phone [phone] Message from:  Patient  Refills Requested: Medication #1:  HYDROCODONE-ACETAMINOPHEN 5-500 MG TABS 1/2 to 1 6 hours as needed for pain. pt is in pain since she started her period and needs asap saw Alvester Morin last and wants to know if he can refill it.  Initial call taken by: De Nurse,  February 25, 2010 12:09 PM  Follow-up for Phone Call        Rx denied because-pt will need to followup with PCP about medication. Of note, pt with questionable drug seeking behavior in the past.  Doree Albee MD February 25, 2010 1:11 PM

## 2010-05-21 NOTE — Miscellaneous (Signed)
  Clinical Lists Changes Called pt lmvm to return call. Need to know if pt has applied for [facility], if so need to bring in card to be scanned into chart before CT can be scheduled. If pt does not have insurance she will have to pay for CT scan out of pocket.Marland KitchenMarland KitchenTessie Fass CMA  March 21, 2010 11:23 AM  spoke to pt, she is waiting to here back about medicaid. Thinks she does not qualify, if not she want to work out payment plan for CT. She is supose to call back tomorrow.Tessie Fass CMA  March 21, 2010 1:57 PM  waiting to here back from pt.Tessie Fass CMA  March 25, 2010 9:07 AM

## 2010-05-21 NOTE — Letter (Signed)
Summary: Controlled substances contract  Controlled substances contract   Imported By: Marily Memos 03/21/2010 12:13:46  _____________________________________________________________________  External Attachment:    Type:   Image     Comment:   External Document

## 2010-05-21 NOTE — Miscellaneous (Signed)
Summary: Procedures Consent  Procedures Consent   Imported By: De Nurse 02/06/2010 15:32:00  _____________________________________________________________________  External Attachment:    Type:   Image     Comment:   External Document

## 2010-05-21 NOTE — Progress Notes (Signed)
  Phone Note Call from Patient   Caller: Patient Call For: [phone] Summary of Call: Pt called to see if she could be seen tomorrow.  Informed patient that you were booked.  Missed her appt with you on 11/10.  Please let front desk or Angelique Blonder know to doublebook her and call with info. Initial call taken by: Abundio Miu,  March 06, 2010 2:32 PM  Follow-up for Phone Call        Double book is OK. Please call pt and let her know. Follow-up by: Clementeen Graham MD,  March 06, 2010 2:35 PM

## 2010-05-21 NOTE — Progress Notes (Signed)
  Phone Note Outgoing Call   Summary of Call: Note to provider in clinic 02/01/10. May provide a limited amount of narcotic pain medication if feels warranted. Please get patient to sign a Rx medication agreemant prior.  Thank you Initial call taken by: Clementeen Graham MD,  January 31, 2010 3:18 PM

## 2010-05-21 NOTE — Letter (Signed)
Summary: PHQ-9  PHQ-9   Imported By: De Nurse 02/27/2010 15:23:32  _____________________________________________________________________  External Attachment:    Type:   Image     Comment:   External Document

## 2010-05-21 NOTE — Assessment & Plan Note (Signed)
Summary: f/u on abdominal pain    Vital Signs:  Patient profile:   25 year old female Height:      64.5 inches Weight:      153.5 pounds BMI:     26.04 Temp:     98.0 degrees F oral Pulse rate:   87 / minute BP sitting:   129 / 91  (left arm) Cuff size:   regular  Vitals Entered By: Doree Albee MD (February 11, 2010 2:40 PM) CC: abdominal pain Is Patient Diabetic? No Pain Assessment Patient in pain? yes     Location: abdomen Intensity: 10 Type: sharp   Primary Care Provider:  Clementeen Graham MD  CC:  abdominal pain.  History of Present Illness: Previous Hx(02/04/10): 24 YOF w/ recurrent abd pain and hx/o ovarian cysts here for acute visit for abdominal pain. Pt states that pain has been present for last 3-5 days. Pain most prominent in RLQ without radiation. + nausea, no vomiting. No fevers, flank pain. Patient states that she has had difficulty in ability to tell when she needs to urinate since abdominal injection for abdominal wall pain 01/30/10; still no dysuria. No vaginal discharge. No alleviating/aggravating factors per patient. Describes pain as intermittently sharp/throbbing. Has not used anything for pain. Not currently menstruating per pt. However, had recent episode prolonged menstual bleeding (>3 weeks) recently in setting of paragard (copper) IUD in place. Pain consistent with prior episodes of ovarian cysts in the past, however pain seems more prominent per pt.   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  Today: Pain somewhat improved. Was taking vicodin for pain but became nauseous with taking and stopped taking. No dysuria, diarrhea, constipation. + nausea. No emesis. Pain predominantly in RLQ. Could not afford zofran. Pain improved but still persistent.   Allergies: No Known Drug Allergies  Physical Exam  General:  alert, in min distress Head:  normocephalic and atraumatic.     Eyes:  vision grossly intact.   Nose:  no external deformity.   Mouth:  good dentition.   Neck:  supple and full ROM.   Lungs:  normal respiratory effort.   Heart:  normal rate, regular rhythm, and no murmur.   Abdomen:  + tenderness to palpation in RLQ, minimal rebound/guarding,  + bowel sounds no distention, no masses, no rigidity, no abdominal hernia, no inguinal hernia, no hepatomegaly, and no splenomegaly.   Extremities:  2+ peripheral pulses Neurologic:  alert & oriented X3 and cranial nerves II-XII intact.     Impression & Recommendations:  Problem # 1:  ABDOMINAL PAIN, RIGHT LOWER QUADRANT (ICD-789.03) Overall exam mildly improved. ? ovarian cyst vs. endoemtrioma on u/s pt w/ previous gyn workup per pt. Will start pt on monophasic OCP for hormonal suppression of ovarians. Will prescribe phenergan for nausea as this is cheaper for pt. Vicodin also re-prescirbed as pt reports throwing away rx since it made her nauseous. Pt instructed to followup w/ PCP for paragard removal and followup of abdomial pain. Discussed abdominal pain red flags.  Orders: [facility]- Est Level  3 (96789)  Complete Medication List: 1)  Citalopram Hydrobromide 10 Mg Tabs (Citalopram hydrobromide) .Marland Kitchen.. 1 by mouth daily 2)  Zofran 4 Mg Tabs (Ondansetron hcl) .Marland Kitchen.. 1 tablet every 6-8 hours as needed for nausea 3)  Vicodin 5-500 Mg Tabs (Hydrocodone-acetaminophen) .Marland Kitchen.. 1 tablet every 4-6 hours as needed for pain (do not exceed more than 6 tablets in 1 day)  4)  Sprintec 28 0.25-35 Mg-mcg Tabs (Norgestimate-eth estradiol) .Marland Kitchen.. 1 tab by mouth daily as instructed on pack 5)  Promethazine Hcl 12.5 Mg Tabs (Promethazine hcl) .Marland Kitchen.. 1-2 tabs  every 4-6 hours as needed for nausea  Patient Instructions: 1)  It was good to see you again  2)  Your abdominal pain is likely coming from an ovarian cyst or possibly endometriosis 3)  I will place on birth control pills to help suppress your ovarian function 4)  I will also prescribe  some phenergan for nausea  5)  I will also represcribe vicodin for pain  6)  Followup with Dr. Denyse Amass in the next 1-2 weeks to remove your paragard 7)  If you have nay other questions or concerns, please give [place] a call 8)  God Bless,  9)  Doree Albee MD  Prescriptions: VICODIN 5-500 MG TABS (HYDROCODONE-ACETAMINOPHEN) 1 tablet every 4-6 hours as needed for pain (DO NOT exceed more than 6 tablets in 1 day)  #30 x 0   Entered and Authorized by:   Doree Albee MD   Signed by:   Doree Albee MD on 02/11/2010   Method used:   Print then Give to Patient   RxID:   [id number] PROMETHAZINE HCL 12.5 MG TABS (PROMETHAZINE HCL) 1-2 tabs  every 4-6 hours as needed for nausea  #40 x 0   Entered and Authorized by:   Doree Albee MD   Signed by:   Doree Albee MD on 02/11/2010   Method used:   Electronically to        [id number] ([facility]       [address]       [place], [place]  [address]       Ph: [phone] or [phone]       Fax: [phone]   RxID:   [id number] SPRINTEC 28 0.25-35 MG-MCG TABS (NORGESTIMATE-ETH ESTRADIOL) 1 tab by mouth daily as instructed on pack  #28 day pack x 6   Entered and Authorized by:   Doree Albee MD   Signed by:   Doree Albee MD on 02/11/2010   Method used:   Electronically to        [id number] ([facility]ail)       [address]       [place], [place]  [address]       Ph: [phone] or [phone]       Fax: [phone]   RxID:   [id number]    Orders Added: 1)  [facility]- Est Level  3 [[address]31]

## 2010-05-21 NOTE — Assessment & Plan Note (Signed)
Summary: RLQ Abd Pain    Vital Signs:  Patient profile:   25 year old female Height:      64.5 inches Weight:      153 pounds Temp:     98.4 degrees F oral Pulse rate:   99 / minute BP sitting:   147 / 84  (left arm) Cuff size:   regular  Vitals Entered By: Tessie Fass CMA (February 04, 2010 1:51 PM)  Primary Care Provider:  Clementeen Graham MD   History of Present Illness: 69 YOF w/ recurrent abd pain and hx/o ovarian cysts here for acute visit for abdominal pain. Pt states that pain has been present for last 3-5 days. Pain most prominent in RLQ without radiation. + nausea, no vomiting. No fevers, flank pain. Patient states that she has had difficulty in ability to tell when she needs to urinate since abdominal injection for abdominal wall pain 01/30/10; still no dysuria. No vaginal discharge. No alleviating/aggravating factors per patient. Describes pain as intermittently sharp/throbbing. Has not used anything for pain. Not currently menstruating per pt. However, had recent episode prolonged menstual bleeding (>3 weeks) recently in setting of paragard (copper) IUD in place. Pain consistent with prior episodes of ovarian cysts in the past, however pain seems more prominent per pt.   Allergies: No Known Drug Allergies  Physical Exam  General:  alert, in mod distress secondary to pain Head:  normocephalic and atraumatic.   Eyes:  vision grossly intact.   Nose:  no external deformity.   Mouth:  good dentition.   Neck:  supple and full ROM.   Lungs:  normal respiratory effort.   Heart:  normal rate, regular rhythm, and no murmur.   Abdomen:  + tenderness to palpation in RLQ, minimal rebound/guarding,  + bowel sounds no distention, no masses, no rigidity, no abdominal hernia, no inguinal hernia, no hepatomegaly, and no splenomegaly.   Extremities:  2+ peripheral pulses Neurologic:  alert & oriented X3 and cranial nerves II-XII intact.     Impression & Recommendations:  Problem # 1:   ABDOMINAL PAIN, RIGHT LOWER QUADRANT (ICD-789.03) Overall broad differential diagnosis for patient, though pain likey secondary to recurrent ovarian cyst. Pt noted to have seen GYN in the past with essentially negative work up. Plan to repeat ultrasound for reevaluation of ovarian cyst. Low likelihood of ovarian torsion given presentation and duration of sxs. Will also check UA for UTI as well as baseline labs for leukocytosis, ?pancreatitis(though unlikely). Wil also prescribe zofran for nausea. Will also give rx for vicodin for pain. Discussed with patient possibility of OCPs for possible resolution of recurrent ovarian cysts. Encouraged to followup with PCP about paragard removal. Abdominal pain red flags reviewed wih patient extensively. Pt agreeable to plan.  Orders: Urinalysis-[facility] (00000) U Preg-[facility] ([address]109) Ultrasound (Ultrasound) Lipase-[facility] ([address]-09811) Zofran 4mg  Tab (EMRORAL) [facility]- Est Level  3 (91478)  Complete Medication List: 1)  Citalopram Hydrobromide 10 Mg Tabs (Citalopram hydrobromide) .Marland Kitchen.. 1 by mouth daily 2)  Zofran 4 Mg Tabs (Ondansetron hcl) .Marland Kitchen.. 1 tablet every 6-8 hours as needed for nausea 3)  Vicodin 5-500 Mg Tabs (Hydrocodone-acetaminophen) .Marland Kitchen.. 1 tablet every 4-6 hours as needed for pain (do not exceed more than 6 tablets in 1 day)  Other Orders: Comp Met-[facility] ([address]-13086) CBC w/Diff-[facility] (57846)  Patient Instructions: 1)  It was good to meet you today  2)  We are going to check some baseline labs today as well as check your urine for infection 3)  We  also perform an ultrasound to evaluate for your ovarian cysts to see if they are ruptured 4)  Taking oral contraceptives may be a good option for ovarian cysts that you may want to consider in the future 5)  you will also be placed on medication for pain on more of a short term basis.  6)  If your pain worsens or you have any other questions, please give [place] a call 7)  God Bless,  8)  Doree Albee MD   Prescriptions: VICODIN 5-500 MG TABS (HYDROCODONE-ACETAMINOPHEN) 1 tablet every 4-6 hours as needed for pain (DO NOT exceed more than 6 tablets in 1 day)  #40 x 0   Entered and Authorized by:   Doree Albee MD   Signed by:   Doree Albee MD on 02/04/2010   Method used:   Printed then faxed to ...       [id number]       [address]       [place], [place]  [address]       Ph: [phone] or [phone]       Fax: [phone]   RxID:   [id number] ZOFRAN 4 MG TABS (ONDANSETRON HCL) 1 tablet every 6-8 hours as needed for nausea  #30 x 0   Entered and Authorized by:   Doree Albee MD   Signed by:   Doree Albee MD on 02/04/2010   Method used:   Electronically to        [id number]       [address]       [place], [place]  [address]       Ph: [phone] or [phone]       Fax: [phone]   RxID:   [id number]    Medication Administration  Medication # 1:    Medication: Zofran 4mg  Tab    Diagnosis: ABDOMINAL PAIN, RIGHT LOWER QUADRANT (ICD-789.03)    Dose: 4mg     Route: po    Exp Date: 08/20/2010    Lot #: [id number]    Mfr: pharmaceuticals    Patient tolerated medication without complications    Given by: Arlyss Repress CMA, (February 04, 2010 2:48 PM)  Orders Added: 1)  Urinalysis-[facility] [00000] 2)  U Preg-[facility] [81025] 3)  Ultrasound [Ultrasound] 4)  Comp Met-[facility] [62376-28315] 5)  CBC w/Diff-[facility] [85025] 6)  Lipase-[facility] [83690-23215] 7)  Zofran 4mg  Tab [EMRORAL] 8)  [facility]- Est Level  3 [99213]    Laboratory Results   Urine Tests  Date/Time Received: February 04, 2010 2:12 PM  Date/Time Reported: February 04, 2010 2:40 PM   Routine Urinalysis   Color: yellow Appearance: Clear Glucose: negative   (Normal Range: Negative) Bilirubin: negative   (Normal Range: Negative) Ketone: negative   (Normal Range: Negative) Spec. Gravity: >=1.030   (Normal Range: 1.003-1.035) Blood: small   (Normal Range: Negative) pH: 5.5   (Normal  Range: 5.0-8.0) Protein: negative   (Normal Range: Negative) Urobilinogen: 0.2   (Normal Range: 0-1) Nitrite: negative   (Normal Range: Negative) Leukocyte Esterace: negative   (Normal Range: Negative)  Urine Microscopic WBC/HPF: rare RBC/HPF: 5-15 Bacteria/HPF: 1+ Mucous/HPF: 2+ Epithelial/HPF: 15-25    Urine HCG: negative Comments: ...............test performed by......Marland KitchenBonnie A. [place], MLS (ASCP)cm       Medication Administration  Medication # 1:    Medication: Zofran 4mg  Tab    Diagnosis: ABDOMINAL PAIN, RIGHT LOWER QUADRANT (ICD-789.03)    Dose: 4mg     Route: po  Exp Date: 08/20/2010    Lot #: [id number]    Mfr: pharmaceuticals    Patient tolerated medication without complications    Given by: Arlyss Repress CMA, (February 04, 2010 2:48 PM)  Orders Added: 1)  Urinalysis-[facility] [00000] 2)  U Preg-[facility] [81025] 3)  Ultrasound [Ultrasound] 4)  Comp Met-[facility] [56387-56433] 5)  CBC w/Diff-[facility] [85025] 6)  Lipase-[facility] [83690-23215] 7)  Zofran 4mg  Tab [EMRORAL] 8)  [facility]- Est Level  3 [29518]

## 2010-05-21 NOTE — Progress Notes (Signed)
Summary: phn msg  Phone Note Call from Patient Call back at Home Phone [phone]   Caller: Patient Summary of Call: pt is requesting to talk to Dr Denyse Amass - asap she cannot continue to stay in this much pain.  not sure what to do. Initial call taken by: De Nurse,  March 20, 2010 12:14 PM  Follow-up for Phone Call        Called and left a message. No more vicodin over the phone.  Will have to be seen in clinic or at the ED.  Wrote an order for a CT scan to eval this pain. Will call pt with scheduling info. Follow-up by: Clementeen Graham MD,  March 20, 2010 4:37 PM

## 2010-05-21 NOTE — Progress Notes (Signed)
Summary: phn msg  Phone Note Call from Patient Call back at [facility] Phone [phone]   Caller: Patient Summary of Call: Pt wanting to get rx for pain meds that she says Dr. Denyse Amass had perscribed for her before.  None were listed so not sure what she is asking for. Initial call taken by: Clydell Hakim,  January 15, 2010 1:41 PM  Follow-up for Phone Call        Saw patient at [facility] GYN clinic.  Cannot re prescribe pain medications without appt. Will follow up on Oct12 or sooner if needed.  Called and no answer.  Follow-up by: Clementeen Graham MD,  January 16, 2010 8:52 AM

## 2010-05-21 NOTE — Progress Notes (Signed)
Summary: triage  Phone Note Call from Patient Call back at Home Phone [phone]   Caller: Patient Summary of Call: pt is in so much pain and can't even pick up her child- letting us know she is on her way here - wants to know if she can get refill on her Vicodin. went to [facility] last Monday  with cysts on ovary Initial call taken by: De Nurse,  February 11, 2010 1:40 PM  Follow-up for Phone Call        [place] results show possible hemorraghic cyst.  Pt in a lot of pain.  Actually did not take the Vic[place] - say it makes her sick.  Worked her in for this afternoon. Follow-up by: Dennison Nancy RN,  February 11, 2010 2:34 PM

## 2010-05-21 NOTE — Miscellaneous (Signed)
Summary: [place] Results  Clinical Lists Changes     US Transvaginal Non-OB - STATUS: Final  IMAGE                                     Perform Date: 17Oct11 15:37  Ordered By: Alvester Morin MD , ISABEL            Ordered Date: 17Oct11 15:00  Facility: [facility]                                Department: [place]  Service Report Text  [facility] Accession Number: [id number]      Clinical Data: Right lower quadrant pain, bleeding for 4 weeks.   History of cyst removed.  History of endometriosis, ovarian cyst.    TRANSABDOMINAL AND TRANSVAGINAL ULTRASOUND OF PELVIS    Technique:  Both transabdominal and transvaginal ultrasound   examinations of the pelvis were performed including evaluation of   the uterus, ovaries, adnexal regions, and pelvic cul-de-sac.   Transabdominal technique was performed for global imaging of the   pelvis.  Transvaginal technique was performed for detailed   evaluation of the endometrium and/or ovaries.    Comparison:  Ultrasound 12/19/2009    Findings:    Uterus the uterus is retroverted and contains intrauterine device.   Uterine measurements are not performed on this endovaginal only   exam.    Endometrium endometrium measures 6.5 mm.    Right Ovary the right ovary is 5.2 x 4.3 x 5.3 cm.  Hypoechoic   circumscribed structure within the right ovary is 4.4 x 3.8 x 4.4   cm.  Within this structure, there are diffuse low level echoes,   consistent with an endometrioma.  Differential diagnosis also   includes a hemorrhagic cyst.  The appearance is stable.    Left Ovary measures 2.5 x 1.6 x 1.9 cm and has a normal appearance.    Other Findings:  There is no free pelvic fluid.    IMPRESSION:   Stable hypoechoic nodule within the right ovary, consistent with an   endometrioma versus hemorrhagic cyst.    Read By:  Patterson Hammersmith,  M.D.   Released By:  Patterson Hammersmith,  M.D.  Additional Information  HL7 RESULT STATUS : F  External image : [phone]  External IF  Update Timestamp : 2010-02-04:15:47:16.000000

## 2010-05-21 NOTE — Letter (Signed)
Summary: Patient health questionaire  Patient health questionaire   Imported By: Marily Memos 03/21/2010 13:30:54  _____________________________________________________________________  External Attachment:    Type:   Image     Comment:   External Document

## 2010-05-21 NOTE — Assessment & Plan Note (Signed)
Summary: f/u pain and anxiety. PAIN CONTRACT SIGNED   Vital Signs:  Patient profile:   25 year old female Height:      64.5 inches Weight:      156 pounds BMI:     26.46 Temp:     98.3 degrees F oral BP sitting:   130 / 84  (right arm) Cuff size:   regular  Vitals Entered By: Tessie Fass CMA (March 07, 2010 9:59 AM) CC: F/U anxiety and abdominal pain Is Patient Diabetic? No Pain Assessment Patient in pain? yes     Location: abdomen Intensity: 8   Primary Care Provider:  Clementeen Graham MD  CC:  F/U anxiety and abdominal pain.  History of Present Illness: Anxiety: Had a panic attack at work recently. Was less severe and shorter than in previous months. Also notes having fewer panic attacks than before celexa. Feels OK about how things are going.   Depression: Thinks is feeling about the same to perhaps better than in previous visits. Does not increased energy and some trouble sleeping. PHQ9 today is 22. No SI/HI. Notes that mother had a transient increase in energy when she was started on celexa. No delusions or hallicunations.   Pain: Thinks that her abdomial pain is improving on birth control pills. Does have occasional episodes of abdomial pain that require hydrocodone a few times a week. Has run out of her 30 tablets of hydrocodone recently. Agrees to prescription drug contract. See scanned documents.   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 1 By Mouth Daily 2)  Sprintec 28 0.25-35 Mg-Mcg Tabs (Norgestimate-Eth Estradiol) .Marland Kitchen.. 1 Tab By Mouth Daily As Instructed On Pack 3)   Hydrocodone-Acetaminophen 5-500 Mg Tabs (Hydrocodone-Acetaminophen) .... 1/2 To 1 6 Hours As Needed For Pain  Allergies (verified): No Known Drug Allergies  Past History:  Past Surgical History: Last updated: 01/30/2010 Hx ovarian cyst removed 9cm via open procedure.  G1P1  Social History: Last updated: 01/30/2010 Lives in [place] with her daughter and husband.  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  Risk Factors: Smoking Status: quit (03/07/2010)  Past Medical History: Panic Disorder vs depression.   Abdominal Pain Menometrorrhagia.  Social History: Reviewed history from 01/30/2010 and no changes required. Lives in [place] with her daughter and husband.  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  Review of Systems       The patient complains of abdominal pain.  The patient denies anorexia, fever, weight loss, chest pain, syncope, dyspnea on exertion, headaches, melena, hematochezia, severe indigestion/heartburn, muscle weakness, and difficulty walking.    Physical Exam  General:  VS noted.  Well woman in NAD Head:  normocephalic and atraumatic.   Eyes:  vision grossly intact.   Ears:  no external deformities.   Nose:  no external deformity.   Mouth:  good dentition mmm Lungs:  Normal respiratory effort, chest expands symmetrically. Lungs are clear to auscultation, no crackles or wheezes. Heart:  Normal rate and regular rhythm. S1 and S2 normal without gallop, murmur, click, rub or other extra sounds. Abdomen:  + tenderness to palpation  in RLQ, minimal rebound/guarding,  + bowel sounds no distention, no masses, no rigidity, no abdominal hernia, no inguinal hernia, no hepatomegaly, and no splenomegaly.   Extremities:  2+ peripheral pulses Cervical Nodes:  No lymphadenopathy noted Axillary Nodes:  No palpable lymphadenopathy Psych:  Oriented X3, memory intact for recent and remote, normally interactive, good eye contact, not anxious appearing, not  depressed appearing, not agitated, not suicidal, and not homicidal.     Impression & Recommendations:  Problem # 1:  ABDOMINAL PAIN, RIGHT LOWER QUADRANT (ICD-789.03) Assessment Improved  Doing somewhat better in the last few weeks. However she continues to have pain that requires vicodin.  Plan: Continue NSAIDs and hydrocodone as needed. I discussed with Ms Fulco that I will only prescribe 30 tablets a month and will not give refills. Filled out Rx pain contract. See scanned docs.  Will follow up in 1 month.   Orders: [facility]- Est  Level 4 ([address]60)  Problem # 2:  DEPRESSION, MAJOR, MODERATE (ICD-296.22) Assessment: Unchanged  Clinically same or improved however PHQ9 is worse. Pt is more activated with celexa. Discussed red flags with patient. Plan to increase to 40 mg daily and follow up in 1 month.  Am continuing to consider the possibility of bipolar disorder. If pt becomes manic or hypomanic will further explore that disgnosis.   Orders: [facility]- Est  Level 4 ([address])  Problem # 3:  PANIC DISORDER WITH AGORAPHOBIA (ICD-300.21) Assessment: Improved  Getting much improved with celexa. Fewer attacks and less severe attacks.  Follow up as above and celexa as above.   Orders: [facility]- Est  Level 4 ([address]214)  Complete Medication List: 1)  Citalopram Hydrobromide 40 Mg Tabs (Citalopram hydrobromide) .Marland Kitchen.. 1 by mouth daily 2)  Sprintec 28 0.25-35 Mg-mcg Tabs (Norgestimate-eth estradiol) .Marland Kitchen.. 1 tab by mouth daily as instructed on pack 3)  Hydrocodone-acetaminophen 5-500 Mg Tabs (Hydrocodone-acetaminophen) .... 1/2 to 1 6 hours as needed for pain  Patient Instructions: 1)  Thank you for seeing me today. 2)  Stop taking the 10mg  celexa twice a day. 3)  Start taking the 40mg  celexa daily.  4)  Take the birth control pills blue pill to blue pill for 3 packs then take the white or pink pills for 1 week then restart.  Goal is 4 periods in 1 year.  5)  Please schedule a follow-up appointment in 3-4  weeks.  6)  Try docusate/colace daily to make you stools very soft.  Prescriptions: HYDROCODONE-ACETAMINOPHEN 5-500 MG TABS (HYDROCODONE-ACETAMINOPHEN) 1/2 to 1 6 hours as needed for pain  #30 x 0   Entered and Authorized by:   Clementeen Graham MD   Signed by:   Clementeen Graham MD on 03/07/2010   Method used:   Print then Give to Patient   RxID:   [id number] CITALOPRAM HYDROBROMIDE 40 MG TABS (CITALOPRAM HYDROBROMIDE) 1 by mouth daily  #30 x 11   Entered and Authorized by:   Clementeen Graham MD   Signed by:   Clementeen Graham MD on 03/07/2010   Method used:   Electronically to        [facility]  [phone]* (retail)       [address]       [place], [place]  [address]       Ph: [phone] or [phone]       Fax: [phone]   RxID:   [id number] CITALOPRAM HYDROBROMIDE 40 MG TABS (CITALOPRAM HYDROBROMIDE) 1 by mouth  daily  #30 x 11   Entered and Authorized by:   Clementeen Graham MD   Signed by:   Clementeen Graham MD on 03/07/2010   Method used:   Electronically to        [id number]* (retail)       [address]       [place], [place]  [address]       Ph: [phone] or [phone]       Fax: [phone]   RxID:   [id number]    Orders Added: 1)  [facility]- Est  Level 4 [[address]18]

## 2010-05-21 NOTE — Progress Notes (Signed)
Summary: refill  Phone Note Refill Request Call back at Home Phone [phone] Message from:  Patient  Refills Requested: Medication #1:  HYDROCODONE-ACETAMINOPHEN 5-500 MG TABS 1/2 to 1 6 hours as needed for pain. Initial call taken by: De Nurse,  March 19, 2010 1:36 PM  Follow-up for Phone Call        Rx denied because discussed dose limit of vicodin.  Follow-up by: Clementeen Graham MD,  March 19, 2010 1:47 PM

## 2010-05-21 NOTE — Progress Notes (Signed)
Summary: meds prob  Phone Note Call from Patient Call back at Home Phone [phone]   Caller: Patient Summary of Call: HYDROCODONE-ACETAMINOPHEN 5-500 MG TABS (HYDROCODONE-ACETAMINOPHEN) 1/2 to 1 6 hours as needed for pain  #30 x 0 is what the last script had on it and needs another refill now please. Please call when ready  Initial call taken by: De Nurse,  February 25, 2010 2:31 PM  Follow-up for Phone Call        Called in medication with no refills.   Pt has menstural bleeding despite OCPs.  Will call back with appt.  Follow-up by: Clementeen Graham MD,  February 25, 2010 2:39 PM

## 2010-05-21 NOTE — Miscellaneous (Signed)
Summary: Consent: IUD Removal  Consent: IUD Removal   Imported By: Knox Royalty 02/22/2010 09:43:14  _____________________________________________________________________  External Attachment:    Type:   Image     Comment:   External Document

## 2010-05-22 ENCOUNTER — Encounter: Payer: Self-pay | Admitting: *Deleted

## 2010-05-22 ENCOUNTER — Ambulatory Visit: Payer: Self-pay | Admitting: Obstetrics and Gynecology

## 2010-05-23 NOTE — Miscellaneous (Signed)
Summary: re: Pelvic US/TS  called pt. advised to r/s [place] appt at [facility]. pt missed appt. Also asked pt to apply for Medicaid. Pt said, that she is in the process of receiving Medicaid. faxed the order to [phone].Marland KitchenArlyss Repress CMA,  April 18, 2010 4:53 PM

## 2010-05-23 NOTE — Progress Notes (Signed)
  Phone Note Call from Patient   Caller: Patient Call For: [phone] Summary of Call: Pt is moving out of town this Wednesday to stay with her father in [place].  She is going to need to have all her rxs written to take with her,but she wants to see you before leaving.   I told her you schedule was full.  She said that you would make a concession for her and doublebook your clinic.  I told her this would have to be approved by you first. Initial call taken by: Abundio Miu,  May 07, 2010 2:44 PM  Follow-up for Phone Call        That is OK. I will send a phone note to [facility] Follow-up by: Clementeen Graham MD,  May 08, 2010 7:40 AM

## 2010-05-23 NOTE — Progress Notes (Signed)
Summary: phn msg  Phone Note Call from Patient Call back at Home Phone [phone]   Caller: Patient Summary of Call: pt is requesting to speak Dr Denyse Amass- she left all her meds in [facility] during holidays and needs them asap Initial call taken by: De Nurse,  April 16, 2010 3:59 PM  Follow-up for Phone Call        Called patient refilled everything except for the hydrocodone. Will see in clinic tomorrow.  Follow-up by: Clementeen Graham MD,  April 17, 2010 2:54 PM    Prescriptions: BUSPIRONE HCL 7.5 MG TABS (BUSPIRONE HCL) 1 tab by mouth bid  #60 x 0   Entered and Authorized by:   Clementeen Graham MD   Signed by:   Clementeen Graham MD on 04/17/2010   Method used:   Electronically to        [id number]* (retail)       [address]       [place], [place]  [address]       Ph: [phone] or [phone]       Fax: [phone]   RxID:   [id number] HYDROCHLOROTHIAZIDE 25 MG TABS (HYDROCHLOROTHIAZIDE) 1/2 tab daily for blood pressure  #30 x 2   Entered and Authorized by:   Clementeen Graham MD   Signed by:   Clementeen Graham MD on 04/17/2010   Method used:   Electronically to        [id number]* (retail)       [address]       [place], [place]  [address]       Ph: [phone] or [phone]       Fax: [phone]   RxID:   [id number] SPRINTEC 28 0.25-35 MG-MCG TABS (NORGESTIMATE-ETH ESTRADIOL) 1 tab by mouth daily as instructed on pack  #30 x 11   Entered and Authorized by:   Clementeen Graham MD   Signed by:   Clementeen Graham MD on 04/17/2010   Method used:   Electronically to        [id number] (retail)       [address]       [place], [place]  [address]       Ph: [phone] or [phone]       Fax: [phone]   RxID:   [id number] CITALOPRAM HYDROBROMIDE 40 MG TABS (CITALOPRAM HYDROBROMIDE) 1 by mouth daily  #30 x 11   Entered and Authorized by:   Clementeen Graham MD   Signed by:   Clementeen Graham MD on 04/17/2010   Method used:   Electronically to        [id number]  (retail)       [address]       [place], [place]  [address]       Ph: [phone] or [phone]       Fax: [phone]   RxID:   [id number]

## 2010-05-23 NOTE — Assessment & Plan Note (Signed)
Summary: f/u eo   Vital Signs:  Patient profile:   25 year old female Weight:      152 pounds Temp:     98.8 degrees F oral Pulse rate:   84 / minute Pulse rhythm:   regular BP sitting:   147 / 95  (left arm) Cuff size:   regular  Vitals Entered By: Loralee Pacas CMA (March 28, 2010 3:02 PM)  Primary Care Itzabella Sorrels:  Clementeen Graham MD  CC:  follow up abdominal pain .  History of Present Illness: Rachel Mcdaniel presents to clinic today to follow up her abdominal pain, anxiety and depression.   1) Abdominal Pain: Continues to be moderate to severe. At this point she requests further workup.  She continues to have some dark occasionally bloody discarge.  Using 1-2 pads daily.  Is using vicodin nightly and accoasionally more. No fever, chills or vomiting or diarrhea.   2) Panic: Has occasional panic attacks still.  She feels that they have worsened in the interum. Is taking celexa as directed. Not having any side effects.    3) Depression. PHQ9 today is 23. Occasional feeling that noone would notice or they would be beter if she were not around. No plan or intent. Lives for her child. No SI/HI.  See scanned PHQ9 document.   4) HTN: Blood pressure is elevated today, and again on recheck. Has been elevatedi in the past. No syncope, headache, vision change. chest pain or palpitations.  Never been on HTn medication before. Mom is on HCTZ she thinks.   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 1 By Mouth Daily 2)  Sprintec 28 0.25-35 Mg-Mcg Tabs (Norgestimate-Eth Estradiol) .Marland Kitchen.. 1  Tab By Mouth Daily As Instructed On Pack 3)  Hydrocodone-Acetaminophen 5-500 Mg Tabs (Hydrocodone-Acetaminophen) .... 1/2 To 1 6 Hours As Needed For Pain 4)  Hydrochlorothiazide 25 Mg Tabs (Hydrochlorothiazide) .... 1/2 Tab Daily For Blood Pressure 5)  Buspirone Hcl 7.5 Mg Tabs (Buspirone Hcl) .Marland Kitchen.. 1 Tab By Mouth Bid  Allergies (verified): No Known Drug Allergies  Past History:  Past Surgical History: Last updated: 01/30/2010 Hx ovarian cyst removed 9cm via open procedure.  G1P1  Family History: Last updated: 03/28/2010 Stroke in parents.  HTN in mother  Social History: Last updated: 01/30/2010 Lives in [place] with her daughter and husband.  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  Risk Factors: Smoking Status: quit (03/28/2010)  Past Medical History: Panic Disorder and  depression.   Abdominal Pain Menometrorrhagia. HTN  Family History: Stroke in parents.  HTN in mother  Social History: Reviewed history from 01/30/2010 and no changes required. Lives in [place] with her daughter and husband.  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  Review of Systems  The patient denies anorexia, fever, weight loss, chest pain, syncope, headaches, abdominal pain, and severe indigestion/heartburn.    Physical Exam  General:  VS noted.  Well woman in NAD Head:  normocephalic and atraumatic.   Eyes:  vision grossly intact.   Ears:  no external deformities.   Nose:  no external deformity.   Mouth:  good dentition mmm Lungs:  Normal respiratory effort, chest expands symmetrically. Lungs are clear to auscultation, no crackles or wheezes. Heart:  Normal rate and regular rhythm. S1 and S2 normal without gallop, murmur, click, rub or other extra sounds. Abdomen:  + tenderness to palpation in RLQ, minimal rebound/guarding,  + bowel sounds no distention, no masses, no rigidity, no abdominal hernia, no inguinal hernia, no hepatomegaly, and no splenomegaly.     Extremities:  2+ peripheral pulses Cervical Nodes:  No lymphadenopathy noted Axillary Nodes:  No palpable lymphadenopathy Psych:  Oriented X3, memory intact for recent and remote, normally interactive, good eye contact, not anxious appearing, not depressed appearing, not agitated, not suicidal, and not homicidal.     Impression & Recommendations:  Problem # 1:  ABDOMINAL PAIN, RIGHT LOWER QUADRANT (ICD-789.03) Assessment Unchanged At this point pain continues to be an issue. Will repeat ultrasound. If unchanged or worse will refer back to GYN for possibly surgury vs lupron and hormone replacement.  Favor endometritis as source of pain.  Will also provide vicodin at today's vvisit. #60. Cotinue to titrate dose. Red flags given. Will follow up in 1 month.   Orders: Ultrasound (Ultrasound) [facility]- Est  Level 4 ([address])  Problem # 2:  PANIC DISORDER WITH AGORAPHOBIA (ICD-300.21) Assessment: Deteriorated  Worsened slightly. Near max dose of celexa. Will add buspar at low dose. Will titrate up at the next visit.  Will also refer to Dr. Pascal Lux for therapy. Rachel Mcdaniel could use some 1 on 1 time to disucss coping straergies for panic, depression, and pain management. She agrees and will follow up. See instructions.   Orders: [facility]- Est  Level 4 ([address])  Problem # 3:  DEPRESSION, MAJOR, MODERATE (ICD-296.22) Assessment: Unchanged  See panic for plan. No SI/HI. Will follow up. Red flags reviewed.   Orders: [facility]- Est  Level 4 ([address])  Problem # 4:  HYPERTENSION, BENIGN (ICD-401.1) Assessment: New  New diagnmosis today.  Will start HCTZ 12.5 and follow. Will obtain BMP at the next visit to address renal function and K.  Will titrate BP medication.   Her updated medication list for this problem includes:    Hydrochlorothiazide 25 Mg Tabs (Hydrochlorothiazide) .Marland Kitchen... 1/2 tab daily for blood pressure  BP today: 147/95 Prior BP: 130/84 (03/07/2010)  Labs Reviewed: K+: 4.8 (02/04/2010) Creat: :  0.71 (02/04/2010)     Orders: [facility]- Est  Level 4 ([address]78)  Complete Medication List: 1)  Citalopram Hydrobromide 40 Mg Tabs (Citalopram hydrobromide) .Marland Kitchen.. 1 by mouth daily 2)  Sprintec 28 0.25-35 Mg-mcg Tabs (Norgestimate-eth estradiol) .Marland Kitchen.. 1 tab by mouth daily as instructed on pack 3)  Hydrocodone-acetaminophen 5-500 Mg Tabs (Hydrocodone-acetaminophen) .... 1/2 to 1 6 hours as needed for pain 4)  Hydrochlorothiazide 25 Mg Tabs (Hydrochlorothiazide) .... 1/2 tab daily for blood pressure 5)  Buspirone Hcl 7.5 Mg Tabs (Buspirone hcl) .Marland Kitchen.. 1 tab by mouth bid  Patient Instructions: 1)  Thank you for seeing me today. 2)  For panic. If you have an attack try breathing slowly or holding your breath in a safe place.  IT WILL PASS. People do not die from panic attacks.  3)  Please make an appointment with Dr. Pascal Lux on your way out.  4)  Take the buspar twice daily with your celexa that you take daily.  5)  For Pain: Use vicodin as directed.  6)  Go for your ultrasound.  7)  For blood pressure take HCTZ 1/2  tab (12.5 mg) daily.  8)  Come back in 1 month to 2 weeks.  9)  Let me know if you think about hurting yourself or others.  Prescriptions: BUSPIRONE HCL 7.5 MG TABS (BUSPIRONE HCL) 1 tab by mouth bid  #60 x 0   Entered and Authorized by:   Clementeen Graham MD   Signed by:   Clementeen Graham MD on 03/28/2010   Method used:   Electronically to        [facility]  [phone]* (retail)       [address]       [place], [place]  [address]       Ph: [phone] or [phone]       Fax: [phone]   RxID:   [id number] HYDROCHLOROTHIAZIDE 25 MG TABS (HYDROCHLOROTHIAZIDE) 1/2 tab daily for blood pressure  #30 x 2   Entered and Authorized by:   Clementeen Graham MD   Signed by:   Clementeen Graham MD on 03/28/2010   Method used:   Electronically to        [facility]  [phone]* (retail)       [address]       [place], [place]  [address]        Ph: [phone] or [phone]       Fax: [phone]   RxID:   [id number] AMLODIPINE BESYLATE 5 MG TABS (AMLODIPINE BESYLATE) 1 by mouth daily  #30 x 2   Entered and Authorized by:   Clementeen Graham MD   Signed by:   Clementeen Graham MD on 03/28/2010   Method used:   Print then Give to Patient   RxID:   [id number] HYDROCODONE-ACETAMINOPHEN 5-500 MG TABS (HYDROCODONE-ACETAMINOPHEN) 1/2 to 1 6 hours as needed for pain  #60 x 0   Entered and Authorized by:   Clementeen Graham MD   Signed by:   Clementeen Graham MD on 03/28/2010   Method used:   Print then Give to Patient   RxID:   [id number]    Orders Added: 1)  Ultrasound [Ultrasound] 2)  [facility]- Est  Level 4 [[address]]     Prevention & Chronic Care Immunizations   Influenza vaccine: Not documented    Tetanus booster: Not documented    Pneumococcal vaccine: Not documented  Other Screening   Pap smear: Not documented   Smoking status: quit  (03/28/2010)  Hypertension   Last Blood Pressure: 147 / 95  (03/28/2010)   Serum creatinine: 0.71  (02/04/2010)   Serum potassium 4.8  (02/04/2010)    Hypertension flowsheet reviewed?: Yes   Progress toward BP goal: Deteriorated  Self-Management Support :   Personal Goals (by the next clinic visit) :      Personal blood pressure goal: 140/90  (03/28/2010)   Patient will work on the following items until the next clinic visit to reach self-care goals:     Medications and monitoring: take my medicines every day, bring all of my medications to every visit  (03/28/2010)     Activity: take a 30 minute walk every day  (03/28/2010)    Hypertension self-management support: Not documented

## 2010-05-23 NOTE — Miscellaneous (Signed)
Summary: call from [facility]  Clinical Lists Changes received call from Diane of [facility] ultrasound dept. she reports that patient No Showed her appointment today. Theresia Lo RN  April 02, 2010 4:40 PM

## 2010-05-23 NOTE — Assessment & Plan Note (Signed)
Summary: f/u eo   Vital Signs:  Patient profile:   25 year old female Height:      64.5 inches Weight:      144.5 pounds BMI:     24.51 Temp:     98.9 degrees F oral Pulse rate:   79 / minute BP sitting:   142 / 84  (left arm) Cuff size:   regular  Vitals Entered By: Jimmy Footman, CMA (April 18, 2010 4:05 PM) CC: pain follow up/ bleeding since 12/1 Is Patient Diabetic? No Pain Assessment Patient in pain? yes        Primary Care Provider:  Clementeen Graham MD  CC:  pain follow up/ bleeding since 12/1.  History of Present Illness: Continues to have abdominal pain and some bleeding since her last visit. Bleediing is intermintant Pain is persistant and is somewhat releived by hydrocodone. She notes that she left her bottle of hydrocodone in [place] Christmas. She understands our policy of controlled substances.  She did not make it to her ultrasound appointment last month.  She do want to get better an will go for an ultrasound this month. Hypertension Medications: HCTZ 12.5 (restarted drug today and is now taking a full tablet of HCTZ Headache:No Pre/Syncope:No Chest Pain, Palpitation, Dyspnea:No Home BP:Not checking  Anxiety: Has been taking celexa and buspar as directed Continues o thave panic attacks. She has about 2 per eek. She had a severe one in [place] which lasted about 30 minutes. Her mother gave her a  xanax which she think shelped a lot. She would like xanax if possible. .   Habits & Providers  Alcohol-Tobacco-Diet     Tobacco Status: quit  Current Problems (verified): 1)  Hypertension, Benign  (ICD-401.1) 2)  Depression, Major, Moderate  (ICD-296.22) 3)  Abdominal Pain, Right Lower Quadrant  (ICD-789.03) 4)  Irregular Menstrual Cycle  (ICD-626.4) 5)  Panic Disorder With Agoraphobia  (ICD-300.21)  Current Medications (verified): 1)  Citalopram Hydrobromide 40 Mg Tabs (Citalopram Hydrobromide) .Marland Kitchen.. 1 By Mouth Daily 2)  Sprintec 28 0.25-35  Mg-Mcg Tabs (Norgestimate-Eth Estradiol) .Marland Kitchen.. 1 Tab By Mouth Daily As Instructed On Pack 3)  Hydrocodone-Acetaminophen 5-500 Mg Tabs (Hydrocodone-Acetaminophen) .... 1/2 To 1 6 Hours As Needed For Pain Do Not Fill Until 04-28-09 4)  Hydrochlorothiazide 25 Mg Tabs (Hydrochlorothiazide) .... 1/2 Tab Daily For Blood Pressure 5)  Buspirone Hcl 10 Mg Tabs (Buspirone Hcl) .Marland Kitchen.. 1 By Mouth Bid  Allergies (verified): No Known Drug Allergies  Past History:  Past Medical History: Last updated: 03/28/2010 Panic Disorder and  depression.   Abdominal Pain Menometrorrhagia. HTN  Past Surgical History: Last updated: 01/30/2010 Hx ovarian cyst removed 9cm via open procedure.  G1P1  Family History: Last updated: 03/28/2010 Stroke in parents.  HTN in mother  Social History: Last updated: 01/30/2010 Lives in [place] with her daughter and husband.  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  Risk Factors: Smoking Status: quit (04/18/2010)  Review of Systems       The patient complains of abdominal pain and abnormal bleeding.  The patient denies anorexia, fever, weight loss, chest pain, melena, severe indigestion/heartburn, incontinence, and genital sores.    Physical Exam  General:  VS noted.  Well woman in NAD Ears:  no external deformities.   Mouth:  good dentition mmm Lungs:  Normal respiratory effort, chest expands symmetrically. Lungs are clear to auscultation, no crackles or wheezes. Heart:  Normal rate and regular rhythm. S1 and  S2 normal without gallop, murmur, click, rub or other extra sounds. Extremities:  2+ peripheral pulses Psych:  Oriented X3, memory intact for recent and remote, normally interactive, good eye contact, not anxious appearing, not depressed appearing, not agitated, not suicidal, and not homicidal.     Impression & Recommendations:  Problem # 1:  ABDOMINAL PAIN, RIGHT LOWER QUADRANT (ICD-789.03) Assessment Unchanged  Continues to be bothersome. Did  not go for her ultrasound.  Plan: Reschedule ultrasound ASAP. Refill vicodin with fill date 04-28-10. Will follow up.  Red flags reviewed.   Orders: [facility]- Est Level  3 (81191)  Problem # 2:  PANIC DISORDER WITH AGORAPHOBIA (ICD-300.21) Assessment: Unchanged  Continues to have panic. Somewhat improved. Plan to increase buspar to 10 mg twice a day.  Will follow up.   Orders: [facility]- Est Level  3 (47829)  Problem # 3:  HYPERTENSION, BENIGN (ICD-401.1) Assessment: Improved  BP is somewhat improved but she is taking 25 mg daily now. Plan to have [facility] and reccord for the next visit in 1 month.  Her updated medication list for this problem includes:    Hydrochlorothiazide 25 Mg Tabs (Hydrochlorothiazide) .Marland Kitchen... 1 tab daily for blood pressure  Orders: [facility]- Est Level  3 (99213)  Complete Medication List: 1)  Citalopram Hydrobromide 40 Mg Tabs (Citalopram hydrobromide) .Marland Kitchen.. 1 by mouth daily 2)  Sprintec 28 0.25-35 Mg-mcg Tabs (Norgestimate-eth estradiol) .Marland Kitchen.. 1 tab by mouth daily as instructed on pack 3)  Hydrocodone-acetaminophen 5-500 Mg Tabs (Hydrocodone-acetaminophen) .... 1/2 to 1 6 hours as needed for pain do not fill until 04-28-09 4)  Hydrochlorothiazide 25 Mg Tabs (Hydrochlorothiazide) .Marland Kitchen.. 1 tab daily for blood pressure 5)  Buspirone Hcl 10 Mg Tabs (Buspirone hcl) .Marland Kitchen.. 1 by mouth bid  Patient Instructions: 1)  Thank you for seeing me today. 2)  Take a full tab of the HCTZ 3)  We will call you for the ultrasound time. 4)  Continue your sprintec. 5)  You can fill your vicodin on 04-28-10 6)  Call for questions.  7)  Record your blood pressure a few times a week.  8)  Let me know if it is 150/90 or higher more than a few times.  9)  Please schedule a follow-up appointment in 1 month.  Prescriptions: BUSPIRONE HCL 10 MG TABS (BUSPIRONE HCL) 1 by mouth bid  #60 x 3   Entered and Authorized by:   Clementeen Graham MD   Signed by:   Clementeen Graham MD on 04/18/2010   Method  used:   [facility]ically to        [facility]       [address]       [place], [place]  [address]       Ph: [phone] or [phone]       Fax: [phone]   RxID:   [id number] HYDROCODONE-ACETAMINOPHEN 5-500 MG TABS (HYDROCODONE-ACETAMINOPHEN) 1/2 to 1 6 hours as needed for pain Do not fill until 04-28-09  #60 x 0   Entered and Authorized by:   Clementeen Graham MD   Signed by:   Clementeen Graham MD on 04/18/2010   Method used:   Print then Give to Patient   RxID:   [id number]    Orders Added: 1)  [facility]- Est Level  3 [[id number]]

## 2010-05-23 NOTE — Letter (Signed)
Summary: Out of Work  [facility]  [address]   [place], [place] [address]   Phone: [phone]  Fax: [phone]    April 18, 2010   Employee:  Rachel Mcdaniel    To Whom It May Concern:   For Medical reasons, please excuse the above named employee from work for the following dates:  April 18, 2010  If you need additional information, please feel free to contact our office.         Sincerely,    Jimmy Footman, CMA

## 2010-05-27 ENCOUNTER — Telehealth: Payer: Self-pay | Admitting: Family Medicine

## 2010-06-04 ENCOUNTER — Inpatient Hospital Stay (HOSPITAL_COMMUNITY)
Admission: AD | Admit: 2010-06-04 | Discharge: 2010-06-04 | Disposition: A | Discharge status: Home / Self Care | Payer: Self-pay | Source: Ambulatory Visit | Attending: Obstetrics and Gynecology | Admitting: Obstetrics and Gynecology

## 2010-06-04 DIAGNOSIS — R109 Unspecified abdominal pain: Secondary | ICD-10-CM

## 2010-06-04 LAB — URINALYSIS, ROUTINE W REFLEX MICROSCOPIC
Hgb urine dipstick: NEGATIVE
Specific Gravity, Urine: 1.025 (ref 1.005–1.030)
Urine Glucose, Fasting: NEGATIVE mg/dL
pH: 6.5 (ref 5.0–8.0)

## 2010-06-06 ENCOUNTER — Ambulatory Visit (INDEPENDENT_AMBULATORY_CARE_PROVIDER_SITE_OTHER): Payer: Self-pay | Admitting: Obstetrics & Gynecology

## 2010-06-06 DIAGNOSIS — Z09 Encounter for follow-up examination after completed treatment for conditions other than malignant neoplasm: Secondary | ICD-10-CM

## 2010-06-06 NOTE — Progress Notes (Signed)
Summary: Phn Msg ASAP  Phone Note Call from Patient Call back at Home Phone [phone]   Reason for Call: Talk to Doctor Summary of Call: pt having a lot of issues, not able to schedule an appt, asking MD to call her, said she had a very important question.  Initial call taken by: Knox Royalty,  May 27, 2010 11:11 AM  Follow-up for Phone Call        pt is asking for her hydrocodone 500 - she is going out of town before the 8th. Follow-up by: De Nurse,  May 27, 2010 1:53 PM  Additional Follow-up for Phone Call Additional follow up Details #1::        spoke w/ Denyse Amass and he said she would need to be seen first - he is here on Wednesday. Additional Follow-up by: De Nurse,  May 27, 2010 2:05 PM    Additional Follow-up for Phone Call Additional follow up Details #2::    gave her the message. she will call & make an appt for wed with pcp only Follow-up by: Golden Circle RN,  May 27, 2010 2:12 PM

## 2010-06-07 ENCOUNTER — Ambulatory Visit: Payer: Self-pay | Admitting: Family Medicine

## 2010-06-21 ENCOUNTER — Ambulatory Visit: Payer: Self-pay | Admitting: Obstetrics & Gynecology

## 2010-06-21 ENCOUNTER — Emergency Department (HOSPITAL_BASED_OUTPATIENT_CLINIC_OR_DEPARTMENT_OTHER)
Admission: EM | Admit: 2010-06-21 | Discharge: 2010-06-21 | Disposition: A | Discharge status: Home / Self Care | Payer: Self-pay | Attending: Emergency Medicine | Admitting: Emergency Medicine

## 2010-06-21 ENCOUNTER — Inpatient Hospital Stay (HOSPITAL_COMMUNITY)
Admission: AD | Admit: 2010-06-21 | Discharge: 2010-06-21 | Disposition: A | Discharge status: Home / Self Care | Payer: Self-pay | Source: Ambulatory Visit | Attending: Obstetrics & Gynecology | Admitting: Obstetrics & Gynecology

## 2010-06-21 DIAGNOSIS — G8929 Other chronic pain: Secondary | ICD-10-CM | POA: Insufficient documentation

## 2010-06-21 DIAGNOSIS — R109 Unspecified abdominal pain: Secondary | ICD-10-CM

## 2010-06-21 DIAGNOSIS — F411 Generalized anxiety disorder: Secondary | ICD-10-CM | POA: Insufficient documentation

## 2010-06-21 DIAGNOSIS — N809 Endometriosis, unspecified: Secondary | ICD-10-CM | POA: Insufficient documentation

## 2010-06-21 DIAGNOSIS — I10 Essential (primary) hypertension: Secondary | ICD-10-CM | POA: Insufficient documentation

## 2010-06-21 LAB — URINALYSIS, ROUTINE W REFLEX MICROSCOPIC
Ketones, ur: NEGATIVE mg/dL
Leukocytes, UA: NEGATIVE
Nitrite: NEGATIVE
Specific Gravity, Urine: 1.016 (ref 1.005–1.030)
pH: 6 (ref 5.0–8.0)

## 2010-06-21 LAB — URINE MICROSCOPIC-ADD ON

## 2010-06-21 LAB — PREGNANCY, URINE: Preg Test, Ur: NEGATIVE

## 2010-06-25 ENCOUNTER — Inpatient Hospital Stay (INDEPENDENT_AMBULATORY_CARE_PROVIDER_SITE_OTHER)
Admit: 2010-06-25 | Discharge: 2010-06-25 | Disposition: A | Discharge status: Home / Self Care | Payer: Self-pay | Attending: Emergency Medicine | Admitting: Emergency Medicine

## 2010-06-25 ENCOUNTER — Emergency Department (HOSPITAL_COMMUNITY)
Admission: EM | Admit: 2010-06-25 | Discharge: 2010-06-25 | Discharge status: Against Medical Advice | Payer: Self-pay | Attending: Emergency Medicine | Admitting: Emergency Medicine

## 2010-06-25 DIAGNOSIS — N8000 Endometriosis of the uterus, unspecified: Secondary | ICD-10-CM

## 2010-06-25 DIAGNOSIS — R109 Unspecified abdominal pain: Secondary | ICD-10-CM

## 2010-06-25 DIAGNOSIS — R1033 Periumbilical pain: Secondary | ICD-10-CM | POA: Insufficient documentation

## 2010-06-26 ENCOUNTER — Ambulatory Visit (INDEPENDENT_AMBULATORY_CARE_PROVIDER_SITE_OTHER): Payer: Self-pay | Admitting: Family Medicine

## 2010-06-26 ENCOUNTER — Encounter: Payer: Self-pay | Admitting: Family Medicine

## 2010-06-26 DIAGNOSIS — I10 Essential (primary) hypertension: Secondary | ICD-10-CM

## 2010-06-26 DIAGNOSIS — R1031 Right lower quadrant pain: Secondary | ICD-10-CM

## 2010-06-26 DIAGNOSIS — F4001 Agoraphobia with panic disorder: Secondary | ICD-10-CM

## 2010-06-26 MED ORDER — HYDROXYZINE PAMOATE 50 MG PO CAPS
50.0000 mg | ORAL_CAPSULE | Freq: Three times a day (TID) | ORAL | Status: AC | PRN
Start: 2010-06-26 — End: 2010-07-06

## 2010-06-26 MED ORDER — BUSPIRONE HCL 7.5 MG PO TABS: 7.5000 mg | ORAL_TABLET | Freq: Three times a day (TID) | ORAL | Status: DC

## 2010-06-26 NOTE — Progress Notes (Signed)
Rachel Mcdaniel presents to clinic today to discuss her anxiety pelvic pain and HTN  1) Anxiety: Rachel Mcdaniel and her husband recently separated. She was living in [place] temporally during the break up.  This has increased her anxiety. Additionally she stopped taking both the celexa and buspar as she feels like they were not helping. She is having daily panic attacks currently.  She has however been taking her mother's Xanax which she thinks works well. She is agreeable to going into counseling but she never made the appointment with Dr. Pascal Lux as perviousy requested as she was planning on leaving town.  She is agreeable to Dr. Pascal Lux and the [facility].    2) Pelvis Pain:  Currently being managed by myself and [facility]YN clinic. Please see dictated notes in E-Chart.   She continues to have pain. In the interim she has had an ovary removed and was noted to have endometriosis.  She has been using high doses of po narcotics by multiple providers in the interim. This is a violation of the controlled medication use agreement. Rachel Mcdaniel is agreeable to going to the pain clinic.   3) Belly Button: Rachel Mcdaniel is concerned that she has an infection at the port site of her lap surgery.   She has pain as above and notes a small amount of green fluid being expressed. No fevers or chills. No skin redness.   ROS: As above. No wt loss.   Exam:  Vs noted.  Gen: Anxious appearing. Crying and smiling.  HEENT: EOMI, PERRL, MMM Lungs: CTABL Nl WOB Heart: RRR no MRG Abd: NABS, Soft, ND, Mild TTP low abd/pelvis. Umbilicus is normal appearing. No fluid expressed on my exam. Wound is well healing. No surrounding erythremia.  Exts: Non edematous BL  LE Psych:  AOx3.  Anxiouls appearing. Smiling and then crying. Rapidly changing.

## 2010-06-26 NOTE — Patient Instructions (Signed)
Thank you for coming in today.  I am trying to get you into the pain clinic at [facility].  We will be in touch about that.  I think the mood disorder clinic as well as the other options that we talked about are a good choice for management of your panic disorder. Please continue HCTZ for blood pressure.  Keep working with [facility].  Come back next month or the month after for follow up.

## 2010-06-27 ENCOUNTER — Encounter: Payer: Self-pay | Admitting: Psychology

## 2010-06-27 ENCOUNTER — Encounter: Payer: Self-pay | Admitting: Family Medicine

## 2010-06-27 ENCOUNTER — Telehealth: Payer: Self-pay | Admitting: Psychology

## 2010-06-27 DIAGNOSIS — F4001 Agoraphobia with panic disorder: Secondary | ICD-10-CM

## 2010-06-27 DIAGNOSIS — Z720 Tobacco use: Secondary | ICD-10-CM | POA: Insufficient documentation

## 2010-06-27 NOTE — Assessment & Plan Note (Signed)
Chronic.    Violated controlled substance use agreement. Also managed at [facility] GYN clinic.  Plan: We will no longer prescribe narcotic pain meds. [facility]. I feel that she does have pain however I cannot differentiate between overuse, or narcotic addiction or miss use or just under treated pain.  Will follow.
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
## 2010-06-27 NOTE — Assessment & Plan Note (Signed)
Taking HCTZ. BP elevated when anxious. Not at goal today.  However she is very anxious. Additionally she is near goal.  Plan: Only make 1-2 med changes per visit. Will re-eval at the next visit.

## 2010-06-27 NOTE — Assessment & Plan Note (Signed)
Previous trial of Celexa and Buspar. Pt stopped on own due to side effects. Currently taking mother's Xanax.  Appears to be near crisis. No SI/HI.  Plan: NO XANAX.  Will reinitiate therapy with buspar 7.5.   Will also use hydroxezine prn. Will refer to [facility] and Dr. Pascal Lux.  F/u in 1 month.
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
## 2010-06-27 NOTE — Progress Notes (Deleted)
  Subjective:    Patient ID: Rachel Mcdaniel, female    DOB: [birth year], 25 y.o.   MRN: [id number]  HPI    Review of Systems     Objective:   Physical Exam        Assessment & Plan:

## 2010-07-01 NOTE — Telephone Encounter (Signed)
Patient has yet to return phone call (07/01/10).  Will close phone note and await patient follow-up.

## 2010-07-02 LAB — URINALYSIS, ROUTINE W REFLEX MICROSCOPIC
Glucose, UA: NEGATIVE mg/dL
Glucose, UA: NEGATIVE mg/dL
Ketones, ur: NEGATIVE mg/dL
Nitrite: NEGATIVE
Protein, ur: 30 mg/dL — AB
Specific Gravity, Urine: 1.025 (ref 1.005–1.030)
Specific Gravity, Urine: 1.027 (ref 1.005–1.030)
Urobilinogen, UA: 0.2 mg/dL (ref 0.0–1.0)
pH: 5.5 (ref 5.0–8.0)
pH: 5.5 (ref 5.0–8.0)

## 2010-07-02 LAB — URINE MICROSCOPIC-ADD ON

## 2010-07-02 LAB — DIFFERENTIAL
Basophils Absolute: 0.1 K/uL (ref 0.0–0.1)
Basophils Relative: 1 % (ref 0–1)
Eosinophils Absolute: 0.1 10*3/uL (ref 0.0–0.7)
Eosinophils Absolute: 0.1 K/uL (ref 0.0–0.7)
Eosinophils Relative: 1 % (ref 0–5)
Eosinophils Relative: 1 % (ref 0–5)
Lymphocytes Relative: 25 % (ref 12–46)
Lymphs Abs: 2.7 10*3/uL (ref 0.7–4.0)
Lymphs Abs: 2.4 10*3/uL (ref 0.7–4.0)
Monocytes Absolute: 0.5 K/uL (ref 0.1–1.0)
Monocytes Relative: 5 % (ref 3–12)
Neutro Abs: 6.4 10*3/uL (ref 1.7–7.7)
Neutrophils Relative %: 68 % (ref 43–77)

## 2010-07-02 LAB — CBC
HCT: 31.5 % — ABNORMAL LOW (ref 36.0–46.0)
Hemoglobin: 10.7 g/dL — ABNORMAL LOW (ref 12.0–15.0)
MCH: 27.5 pg (ref 26.0–34.0)
MCHC: 33.4 g/dL (ref 30.0–36.0)
MCHC: 33.9 g/dL (ref 30.0–36.0)
MCV: 81 fL (ref 78.0–100.0)
Platelets: 308 10*3/uL (ref 150–400)
Platelets: 375 10*3/uL (ref 150–400)
RBC: 3.89 MIL/uL (ref 3.87–5.11)
RDW: 15.6 % — ABNORMAL HIGH (ref 11.5–15.5)
RDW: 16.5 % — ABNORMAL HIGH (ref 11.5–15.5)
WBC: 9.4 10*3/uL (ref 4.0–10.5)

## 2010-07-02 LAB — GC/CHLAMYDIA PROBE AMP, GENITAL
Chlamydia, DNA Probe: NEGATIVE
GC Probe Amp, Genital: NEGATIVE

## 2010-07-02 LAB — BASIC METABOLIC PANEL
Calcium: 9.4 mg/dL (ref 8.4–10.5)
GFR calc Af Amer: >60 mL/min (ref >60)
GFR calc non Af Amer: >60 mL/min (ref >60)
Sodium: 139 mEq/L (ref 135–145)

## 2010-07-02 LAB — BASIC METABOLIC PANEL WITH GFR
BUN: 8 mg/dL (ref 6–23)
CO2: 21 meq/L (ref 19–32)
Chloride: 111 meq/L (ref 96–112)
Creatinine, Ser: 0.7 mg/dL (ref 0.4–1.2)
Glucose, Bld: 93 mg/dL (ref 70–99)
Potassium: 4.4 meq/L (ref 3.5–5.1)

## 2010-07-02 LAB — LIPASE, BLOOD: Lipase: 22 U/L (ref 11–59)

## 2010-07-02 LAB — RAPID URINE DRUG SCREEN, HOSP PERFORMED: Cocaine: NOT DETECTED

## 2010-07-02 LAB — POCT PREGNANCY, URINE
Preg Test, Ur: NEGATIVE
Preg Test, Ur: NEGATIVE

## 2010-07-02 LAB — WET PREP, GENITAL: Trich, Wet Prep: NONE SEEN

## 2010-07-04 LAB — URINE MICROSCOPIC-ADD ON

## 2010-07-04 LAB — URINALYSIS, ROUTINE W REFLEX MICROSCOPIC
Glucose, UA: NEGATIVE mg/dL
Leukocytes, UA: NEGATIVE
Nitrite: NEGATIVE
Protein, ur: NEGATIVE mg/dL
pH: 6 (ref 5.0–8.0)

## 2010-07-04 LAB — CBC
Hemoglobin: 10.6 g/dL — ABNORMAL LOW (ref 12.0–15.0)
MCH: 27 pg (ref 26.0–34.0)
MCHC: 33.2 g/dL (ref 30.0–36.0)
MCV: 81.6 fL (ref 78.0–100.0)
RBC: 3.92 MIL/uL (ref 3.87–5.11)

## 2010-07-04 LAB — SAMPLE TO BLOOD BANK

## 2010-07-08 ENCOUNTER — Ambulatory Visit: Payer: Self-pay | Admitting: Obstetrics & Gynecology

## 2010-07-08 DIAGNOSIS — N803 Endometriosis of pelvic peritoneum, unspecified: Secondary | ICD-10-CM

## 2010-07-09 ENCOUNTER — Telehealth: Payer: Self-pay | Admitting: *Deleted

## 2010-07-09 ENCOUNTER — Emergency Department (HOSPITAL_BASED_OUTPATIENT_CLINIC_OR_DEPARTMENT_OTHER)
Admission: EM | Admit: 2010-07-09 | Discharge: 2010-07-09 | Disposition: A | Discharge status: Home / Self Care | Payer: Self-pay | Attending: Emergency Medicine | Admitting: Emergency Medicine

## 2010-07-09 ENCOUNTER — Encounter: Payer: Self-pay | Admitting: Family Medicine

## 2010-07-09 DIAGNOSIS — J029 Acute pharyngitis, unspecified: Secondary | ICD-10-CM | POA: Insufficient documentation

## 2010-07-09 DIAGNOSIS — I10 Essential (primary) hypertension: Secondary | ICD-10-CM | POA: Insufficient documentation

## 2010-07-09 NOTE — Telephone Encounter (Signed)
Called pt and lmvm to call back. Need to know pt's insurance. Dr.Corey wants pt to be referred to Pain Clinic and Psychologist. Need more info from patient. Waiting for call back.Rachel Mcdaniel

## 2010-07-12 NOTE — Progress Notes (Signed)
NAMEJEANNINE, PENNISI              ACCOUNT NO.:  [id number]  MEDICAL RECORD NO.:  [id number]           PATIENT TYPE:  A  LOCATION:  [facility]                   FACILITY:  [facility]  PHYSICIAN:  Jaynie Collins, MD     DATE OF BIRTH:  03-Jun-1985  DATE OF SERVICE:  07/08/2010                                 CLINIC NOTE  Ms. Dines is a 25 year old gravida 1, para 1 who has undergone cystectomy and a right oophorectomy for endometriosis.  The patient also has been treated with multiple prescriptions for Percocet around this time given her pelvic pain.  It has come to our attention, the patient also gets narcotics from her primary care physician Dr. Clementeen Graham at the family practice center and that she was under a pain medicine contract with them which she violated.  The patient comes in today, reporting having increasing pain and is requesting more narcotic medications.  She is also emphasizing that her laparoscopic incision is infected, even though she has been evaluated many times in the past and told that there is no such infection that can be seen.  The patient also reports anxiety and insomnia problems, and she was told that she would need to follow up with her primary care physician for further evaluation of these other symptoms.  The patient was very tearful, really saying that she was in a lot of pain.  She was told that she needs to finish her application for the Lupron as this will be the way to treat the etiology of her pain as opposed to just treating the pain itself.  Given her symptoms, she was given a prescription for Percocet 40 tablets, and a pain contract was entered into with the patient.  She was told that if she gets narcotics from any other provider that this will void the contract and she understands this.  Her abdomen was examined, and there were no signs of infection around her umbilical incision.  She was told that her incision is well healed and that there are no  problems seen.  The patient told that she will follow up in a month for further discussion of her endometriosis and further management.  She was told that the Percocet she was given today should last her until her next visit in 1 month and that we will look forward to having her completed application for the Depo-Lupron so she can get this treatment.          ______________________________ Jaynie Collins, MD    UA/MEDQ  D:  07/08/2010  T:  07/09/2010  Job:  [id number]

## 2010-07-24 ENCOUNTER — Ambulatory Visit: Payer: Self-pay

## 2010-07-24 DIAGNOSIS — N803 Endometriosis of pelvic peritoneum, unspecified: Secondary | ICD-10-CM

## 2010-07-24 DIAGNOSIS — N83209 Unspecified ovarian cyst, unspecified side: Secondary | ICD-10-CM

## 2010-07-24 LAB — POCT PREGNANCY, URINE: Preg Test, Ur: NEGATIVE

## 2010-07-24 LAB — URINE MICROSCOPIC-ADD ON

## 2010-07-24 LAB — URINALYSIS, ROUTINE W REFLEX MICROSCOPIC
Glucose, UA: NEGATIVE mg/dL
Leukocytes, UA: NEGATIVE
Protein, ur: NEGATIVE mg/dL
Specific Gravity, Urine: >1.03 — ABNORMAL HIGH (ref 1.005–1.030)
pH: 6 (ref 5.0–8.0)

## 2010-07-26 LAB — URINALYSIS, ROUTINE W REFLEX MICROSCOPIC
Bilirubin Urine: NEGATIVE
Hgb urine dipstick: NEGATIVE
Ketones, ur: NEGATIVE mg/dL
Nitrite: NEGATIVE
Protein, ur: NEGATIVE mg/dL
Urobilinogen, UA: 0.2 mg/dL (ref 0.0–1.0)

## 2010-07-26 LAB — POCT PREGNANCY, URINE: Preg Test, Ur: NEGATIVE

## 2010-07-26 LAB — DIFFERENTIAL
Basophils Absolute: 0.1 10*3/uL (ref 0.0–0.1)
Eosinophils Relative: 1 % (ref 0–5)
Lymphocytes Relative: 16 % (ref 12–46)
Lymphs Abs: 2.2 10*3/uL (ref 0.7–4.0)
Monocytes Absolute: 0.7 10*3/uL (ref 0.1–1.0)
Neutro Abs: 10.9 10*3/uL — ABNORMAL HIGH (ref 1.7–7.7)

## 2010-07-26 LAB — WET PREP, GENITAL

## 2010-07-26 LAB — CBC
HCT: 33.7 % — ABNORMAL LOW (ref 36.0–46.0)
Hemoglobin: 11.3 g/dL — ABNORMAL LOW (ref 12.0–15.0)
RDW: 14.7 % (ref 11.5–15.5)
WBC: 14 10*3/uL — ABNORMAL HIGH (ref 4.0–10.5)

## 2010-07-26 LAB — GC/CHLAMYDIA PROBE AMP, GENITAL: Chlamydia, DNA Probe: NEGATIVE

## 2010-08-01 LAB — GC/CHLAMYDIA PROBE AMP, GENITAL: Chlamydia, DNA Probe: NEGATIVE

## 2010-08-05 LAB — COMPREHENSIVE METABOLIC PANEL
AST: 17 U/L (ref 0–37)
Albumin: 2.9 g/dL — ABNORMAL LOW (ref 3.5–5.2)
Alkaline Phosphatase: 173 U/L — ABNORMAL HIGH (ref 39–117)
BUN: 5 mg/dL — ABNORMAL LOW (ref 6–23)
CO2: 20 mEq/L (ref 19–32)
Chloride: 106 mEq/L (ref 96–112)
GFR calc non Af Amer: >60 mL/min (ref >60)
Potassium: 3.9 mEq/L (ref 3.5–5.1)
Total Bilirubin: 0.3 mg/dL (ref 0.3–1.2)

## 2010-08-05 LAB — URINALYSIS, ROUTINE W REFLEX MICROSCOPIC
Bilirubin Urine: NEGATIVE
Ketones, ur: NEGATIVE mg/dL
Nitrite: NEGATIVE
Protein, ur: NEGATIVE mg/dL
Urobilinogen, UA: 0.2 mg/dL (ref 0.0–1.0)

## 2010-08-05 LAB — POCT URINALYSIS DIP (DEVICE)
Bilirubin Urine: NEGATIVE
Glucose, UA: NEGATIVE mg/dL
Glucose, UA: NEGATIVE mg/dL
Hgb urine dipstick: NEGATIVE
Hgb urine dipstick: NEGATIVE
Nitrite: NEGATIVE
Nitrite: NEGATIVE
Nitrite: NEGATIVE
Protein, ur: 30 mg/dL — AB
Protein, ur: 30 mg/dL — AB
Specific Gravity, Urine: 1.02 (ref 1.005–1.030)
Specific Gravity, Urine: 1.02 (ref 1.005–1.030)
Urobilinogen, UA: 0.2 mg/dL (ref 0.0–1.0)
Urobilinogen, UA: 0.2 mg/dL (ref 0.0–1.0)
Urobilinogen, UA: 0.2 mg/dL (ref 0.0–1.0)
pH: 6 (ref 5.0–8.0)

## 2010-08-05 LAB — CBC
HCT: 33 % — ABNORMAL LOW (ref 36.0–46.0)
Hemoglobin: 11 g/dL — ABNORMAL LOW (ref 12.0–15.0)
MCHC: 33.3 g/dL (ref 30.0–36.0)
Platelets: 279 10*3/uL (ref 150–400)
Platelets: 291 10*3/uL (ref 150–400)
RBC: 3.7 MIL/uL — ABNORMAL LOW (ref 3.87–5.11)
RDW: 13.2 % (ref 11.5–15.5)
WBC: 14.5 10*3/uL — ABNORMAL HIGH (ref 4.0–10.5)

## 2010-08-05 LAB — URIC ACID: Uric Acid, Serum: 5.2 mg/dL (ref 2.4–7.0)

## 2010-08-05 LAB — RH IMMUNE GLOB WKUP(>/=20WKS)(NOT WOMEN'S HOSP): Fetal Screen: NEGATIVE

## 2010-08-05 LAB — URINE MICROSCOPIC-ADD ON

## 2010-08-05 LAB — RPR: RPR Ser Ql: NONREACTIVE

## 2010-08-06 LAB — POCT URINALYSIS DIP (DEVICE)
Ketones, ur: NEGATIVE mg/dL
Protein, ur: 100 mg/dL — AB
Specific Gravity, Urine: 1.025 (ref 1.005–1.030)
pH: 5.5 (ref 5.0–8.0)

## 2010-08-06 LAB — URINE CULTURE

## 2010-08-15 ENCOUNTER — Ambulatory Visit: Payer: Self-pay | Admitting: Obstetrics & Gynecology

## 2010-09-03 NOTE — H&P (Signed)
[name], [name]              ACCOUNT NO.:  [id number]   MEDICAL RECORD NO.:  [id number]          PATIENT TYPE:  INP   LOCATION:  9309                          [facility]   PHYSICIAN:  Allie Bossier, MD        DATE OF BIRTH:  May 29, 1985   DATE OF ADMISSION:  11/11/2007  DATE OF DISCHARGE:                              HISTORY & PHYSICAL   Rachel Mcdaniel is a 25 year old single white gravida 1, para 0 with an EDC of  May 16, 2016 by a 6-week ultrasound.  This makes her 13-1/2 weeks  estimated gestational age.  She began her prenatal care here in the high-  risk clinic on November 03, 2007, after an ultrasound done on September 21, 2007,  discovered a 8.9-cm complex cyst within the right ovary.  Followup  ultrasound done a month later confirmed that her LDH was normal.  Hepatitis negative.  CA-125 was in the normal range at 28.52.  The  remainder of her prenatal labs are normal including a normal Pap smear.   PAST MEDICAL HISTORY:  None.   PAST SURGICAL HISTORY:  None.   SOCIAL HISTORY:  Negative for tobacco, alcohol, or drug use.   MEDICATIONS:  Prenatal vitamins and Tylenol as necessary.   No known drug allergies.  No latex allergies.   FAMILY HISTORY:  Negative for breast, GYN, and colon malignancies.  Blood type is A negative.  She is rubella immune.  The remainder of her  labs are normal.   REVIEW OF SYSTEMS:  She lives with her stepfather.  She works at  [employer], and she had some spotting early in pregnancy but none  currently.   PHYSICAL EXAMINATION:  VITAL SIGNS:  Weight 150 pounds, blood pressure  127/91.  HEENT:  Normal.  HEART:  Regular rhythm.  LUNGS:  Clear to auscultation bilaterally.  ABDOMEN:  Benign, gravid.  Fetal heart tones noted.  PELVIC:  Consistent with a large pelvic mass.   ASSESSMENT AND PLAN:  A 9-cm complex adnexal mass on the right.  We will  plan for a laparotomy and removal of the right ovary at least and  possibly the right tube as well.   She will receive preoperative  antibiotics and will be kept as an inpatient.  Risks of surgery to the  fetus has been explained and counseled her that the second trimester is  certainly the best time to her surgery such as this.  All questions were  answered.      Allie Bossier, MD  Electronically Signed     MCD/MEDQ  D:  11/11/2007  T:  11/12/2007  Job:  [id number]

## 2010-09-03 NOTE — Op Note (Signed)
Rachel Mcdaniel, Rachel Mcdaniel              ACCOUNT NO.:  [id number]   MEDICAL RECORD NO.:  [id number]          PATIENT TYPE:  INP   LOCATION:  9309                          [facility]ILITY:  [facility]   PHYSICIAN:  Allie Bossier, MD        DATE OF BIRTH:  Sep 09, 1985   DATE OF PROCEDURE:  DATE OF DISCHARGE:                               OPERATIVE REPORT   PREOPERATIVE DIAGNOSES:  1. 13-1/2 weeks pregnancy.  2. A 9-cm complex right ovarian mass.   POSTOPERATIVE DIAGNOSES:  1. 13-1/2 weeks pregnancy.  2. A 9-cm complex right ovarian mass.  3. Endometrioma.   PROCEDURE:  1. Exploratory laparotomy.  2. Lysis of adhesions.  3. Right ovarian cystectomy.   SURGEON:  Allie Bossier, MD   ASSISTANT:  Karlton Lemon, MD   ANESTHESIA:  Spinal, Angelica Pou, MD   COMPLICATIONS:  None.   ESTIMATED BLOOD LOSS:  Minimal.   SPECIMENS:  None.   FINDINGS:  Large right endometrioma, densely adherent to the uterus and  cul-de-sac/ovarian fossa.   DETAILED PROCEDURE AND FINDINGS:  The risks, benefits, and alternatives  of the surgery were explained, understood, and accepted.  Consents were  signed.  She was taken to the operating room.  Spinal anesthesia was  applied without complication.  She was placed in a dorsal supine  position.  Her abdomen was prepped and draped in usual sterile fashion.  A Foley catheter was placed which drained clear urine throughout the  case.  After adequate anesthesia was assured, a transverse incision was  made approximately 2 cm above the symphysis pubis.  Incision was carried  down through scant amount of subcutaneous tissue to the fascia.  Fascia  was scored in the midline, bleeding encountered was cauterized with  Bovie.  Fascial incision was extended bilaterally and curved slightly  upwards.  The rectus muscles on the left were separated approximately  50% in a transverse fashion using electrosurgical technique.  Excellent  hemostasis was maintained.  The rectus muscle on  the right was  transected approximately 75% in a transverse fashion.  Excellent  hemostasis was maintained.  The peritoneum was entered with hemostats.  Peritoneal incision was made bilaterally with a Bovie taking care to  avoid the bladder.  Washings were obtained initially.  The pregnant  uterus was gently displaced and I was able to place Richardson retractor  to elevate the anterior abdominal wall.  I was able to gently place my  hand in her pelvis.  The large ovarian mass was initially not visible.  It was located deep in the cul-de-sac.  It was densely adherent to the  right ovarian fossa and the posterior cul-de-sac as well as the uterus  (posterior aspect of the uterus).  I used gentle digital dissection to  attempt to free it up.  During the process, the endometrioma was  ruptured.  The chocolate fluid was irrigated with 4 L of warm normal  saline until the pelvis was emptied of all visible chocolate cyst  material.  The ovary by this point was normal size (approximately 4 cm).  It  was still densely adherent to the posterior aspect of the uterus in  the right ovarian fossa.  There was no bleeding noted on the ovary  itself and the endometrioma was gone.  Due to dense adhesions, I could  think of no benefit to the patient by doing an extensive dissection and  risking her pregnancy or heavy bleeding, therefore once the chocolate  cyst material was removed from the pelvis and no bleeding was noted at  any site, I elected to leave the remainder of her right ovary in situ.  The left ovary was of normal size and shape and freely mobile.  There  was no bleeding of the rectus muscles or rectus fascia.  The rectus  fascia was closed with 0 Vicryl running nonlocking suture.  No defects  were palpable.  The subcutaneous tissue was irrigated, cleaned, and  dried.  It was then infiltrated with 15 mL and of 0.5% Marcaine.  Subcuticular closure was done with 3-0 Vicryl.  Steri-Strips placed.   She tolerated the procedure well and was taken to the recovery room in  stable condition.  The instrument, sponge, and needle counts were  correct.  Foley catheter drained clear urine throughout.  She tolerated  the procedure well.      Allie Bossier, MD  Electronically Signed     MCD/MEDQ  D:  11/11/2007  T:  11/12/2007  Job:  [id number]

## 2010-09-03 NOTE — Discharge Summary (Signed)
Rachel Mcdaniel, Rachel Mcdaniel              ACCOUNT NO.:  [id number]   MEDICAL RECORD NO.:  [id number]          PATIENT TYPE:  INP   LOCATION:                                [facility]  [facility]   PHYSICIAN:  Tanya S. Shawnie Pons, M.D.   DATE OF BIRTH:  July 24, 1985   DATE OF ADMISSION:  11/11/2007  DATE OF DISCHARGE:                               DISCHARGE SUMMARY   REASON FOR HOSPITALIZATION:  An 8.9 cm right ovarian complex cyst in the  hospital.   PROCEDURES:  Right ovarian cystectomy.   FINAL DIAGNOSIS:  Right ovarian endometrioma.   HOSPITAL COURSE:  This patient is a 25 year old female patient of Dr.  Nicholaus Bloom.  Gravida 1, para 0, [facility] May 17, 2007, 13-1/[redacted] weeks  gestational age with a 8.9 cm complex ovarian cyst noted on the right  ovary on routine obstetrical scan.  The patient was taken to the  operating room on November 11, 2007, at which time, it was noted to have a  right ovarian endometrioma managed by cystectomy.  The patient  postoperatively did well.  She was afebrile.  Postoperative hemoglobin  was 11.0 and hematocrit 32.1.  Discharge hemoglobin was 9.5. Incision  was dry, subcuticular suture present.  Abdomen soft, calves without  tenderness, lungs clear.  There was no vaginal bleeding postoperatively  and fetus demonstrated heart tones. Patient's vital signs were stable  and normal 24 hours prior to discharge.   The patient was discharged with routine postoperative instructions  including contacting the office for temperature elevation above 100.4  degrees Fahrenheit, increasing abdominal or incisional pain, incisional  drainage or erythema, vaginal bleeding or other concerns.  She was  prescribed Percocet 5/325 one to two every 4-6 hours as needed for pain  #40 without refills and Zofran sublingual 8 mg 1 every 8 hours as needed  for nausea.  She will continue her prenatal vitamins and Xanax, which  she takes at home as needed.  The patient was asked to continue her  routine  obstetrical care visits and her incisions will be checked at  that time.  All questions answered to the satisfaction of the said  patient.  The patient verbalized understanding of the same.      Ginger Carne, MD  Electronically Signed     ______________________________  Shelbie Proctor. Shawnie Pons, M.D.    Rutherford Nail  D:  11/13/2007  T:  11/13/2007  Job:  [id number]

## 2010-10-03 ENCOUNTER — Ambulatory Visit: Payer: Self-pay | Admitting: Obstetrics & Gynecology

## 2010-10-03 DIAGNOSIS — N949 Unspecified condition associated with female genital organs and menstrual cycle: Secondary | ICD-10-CM

## 2010-10-04 NOTE — Group Therapy Note (Unsigned)
Rachel Mcdaniel, Rachel Mcdaniel              ACCOUNT NO.:  [id number]  MEDICAL RECORD NO.:  [id number]           PATIENT TYPE:  A  LOCATION:  [facility]                   FACILITY:  [facility]  PHYSICIAN:  Jaynie Collins, MD     DATE OF BIRTH:  1985-09-22  DATE OF SERVICE:  10/03/2010                                 CLINIC NOTE  [name] is a 25-year gravida 1, para 1 with a history of endometriosis was followed in this clinic.  The patient is currently on Depot Lupron which she received done July 24, 2010.  She is also on a pain medication contract with this clinic and receives Percocet from here.  At her visit today, the patient reports that the Depot Lupron did not change her pain.  Her pain is not worse or better, however, she has a gotten a lot of side effects with especially hot flashes because of the Depot Lupron and is not willing to do this modality again.  She is interested in trying a Mirena IUD as she is interested in contraception at this point in addition to trying to control symptoms.  She has been on Depot- Provera in the past and oral contraceptive pills which did not help much with her symptoms.  She wants to get the Mirena IUD a try before she considers total hysterectomy.  The patient is adamantly refusing further Depot Lupron at this point or oral contraceptive pills, Depot-Provera, or other hormonal therapy.  A long discussion was had with the patient and has support person and for now given that she still has the Depot Lupron at her system, it was recommended that she be on norethindrone 5 mg a day to potentiate the effects of the Depot Lupron.  We will see how she tolerates this.  The patient would also be scheduled for an appointment as soon as possible for Mirena IUD placement.  She was told that her Mirena IUD will lead to cramping and irregular bleeding especially in the first few months, but she is willing to undergo this and she says she tolerated a ParaGard that was  placed many years ago very well.  The only problem was that she kept getting menstrual period.  She was told that her periods will become irregular for the first few months with a Mirena and then she would either have oligomenorrhea or amenorrhea or just overall lighter periods every month.  She is willing to try this for now.  The patient was given a prescription for the norethindrone 5 mg daily and she was given a prescription for Percocet 50 tablets which is an increase from the 40 tablets was given because she says that the 40 tablets only lasted for the first 20 days of the month.  We will continue to monitor her symptoms and continue to treat her endometriosis.          ______________________________ Jaynie Collins, MD    UA/MEDQ  D:  10/03/2010  T:  10/04/2010  Job:  [id number]

## 2010-10-21 ENCOUNTER — Ambulatory Visit: Payer: Self-pay | Admitting: Obstetrics and Gynecology

## 2010-11-05 ENCOUNTER — Other Ambulatory Visit: Payer: Self-pay | Admitting: Obstetrics & Gynecology

## 2010-11-05 ENCOUNTER — Encounter: Payer: Self-pay | Admitting: Obstetrics & Gynecology

## 2010-11-05 DIAGNOSIS — N809 Endometriosis, unspecified: Secondary | ICD-10-CM | POA: Insufficient documentation

## 2010-11-05 MED ORDER — OXYCODONE-ACETAMINOPHEN 5-325 MG PO TABS: 1.0000 | ORAL_TABLET | ORAL | Status: DC | PRN

## 2010-11-05 NOTE — Telephone Encounter (Signed)
Percocet 5/325mg  Rx refilled today.

## 2010-11-05 NOTE — Assessment & Plan Note (Signed)
Currently on Norethindrone 5 mg daily. Can increase dosage prn; patient also considering MIrena IUD. On Percocet 5/325mg   #50 every month as specified in her pain contract

## 2010-12-02 ENCOUNTER — Telehealth: Payer: Self-pay | Admitting: *Deleted

## 2010-12-02 NOTE — Telephone Encounter (Signed)
Pt states she gets Rx refill monthly on the 16th or 17th. She is going out of town for 2 wks and would like to pick up Rx a Rachel Mcdaniel early if MD would be willing to write it postdated to whatever date she can get it filled. Please call back and may leave a detailed message if she does not answer.

## 2010-12-03 NOTE — Telephone Encounter (Signed)
Spoke w/pt and informed her that Dr. Macon Large will return to clinic tomorrow and we will call back once we have an answer to her request. Pt voiced understanding.

## 2010-12-03 NOTE — Telephone Encounter (Signed)
Routed to Dr. Macon Large for Rx refill.

## 2010-12-04 ENCOUNTER — Telehealth: Payer: Self-pay | Admitting: Obstetrics & Gynecology

## 2010-12-04 ENCOUNTER — Other Ambulatory Visit: Payer: Self-pay | Admitting: Obstetrics & Gynecology

## 2010-12-04 DIAGNOSIS — F4001 Agoraphobia with panic disorder: Secondary | ICD-10-CM

## 2010-12-04 MED ORDER — OXYCODONE-ACETAMINOPHEN 5-325 MG PO TABS: 1.0000 | ORAL_TABLET | ORAL | Status: DC | PRN

## 2010-12-04 NOTE — Telephone Encounter (Signed)
Called pt and left message that Rx will be ready for her to pick up after 2:00 pm today

## 2010-12-04 NOTE — Telephone Encounter (Signed)
Patient called, was told to pick up prescription for Percocet from the clinic front desk.

## 2010-12-04 NOTE — Telephone Encounter (Signed)
Will write the prescription for her today (when I come to clinic).  Thanks.

## 2010-12-06 DIAGNOSIS — N83201 Unspecified ovarian cyst, right side: Secondary | ICD-10-CM

## 2010-12-20 ENCOUNTER — Ambulatory Visit: Payer: Self-pay | Admitting: Advanced Practice Midwife

## 2011-01-01 ENCOUNTER — Telehealth: Payer: Self-pay | Admitting: *Deleted

## 2011-01-01 MED ORDER — OXYCODONE-ACETAMINOPHEN 5-325 MG PO TABS: 2.0000 | ORAL_TABLET | Freq: Four times a day (QID) | ORAL | Status: DC | PRN

## 2011-01-01 NOTE — Telephone Encounter (Signed)
Called pt and notified her Dr. Macon Large has written new prescription refill for percocet and it is waiting at front desk.  Also notified her that she can talk with the doctor at her rescheduled appointment about her questions about plan of care and possibly changing plan.  Pt. Understands she can reschedule appointment when she picks up prescription, or she can call, or wait for front office  To call her.

## 2011-01-01 NOTE — Telephone Encounter (Addendum)
Pt called today at 12:33 and left a message stating she thinks she missed an appointment for last Friday or maybe it is this coming Friday and wants to reschedule with Dr. Macon Large if she missed it.  Also wants a prescription refill - it's that time of month and Dr. Macon Large normally writes it and leaves at desk for Percocet 5/325.  I also wanted to talk with her- I've been having more pain.  Per computer pt missed an appointment 12/20/10 for IUD insertion. Called pt and notified her we got her message and she missed appt on 8/'31/12 and will have front office to call her to reschedule. Also notified her I  will route message to Dr. Macon Large for refill consideration, then will call pt after Dr. Macon Large notifies [place] if she will refill med.Pt. States understands plan.  New prescription ordered and printed.  Patient to pick up from front desk of clinic.

## 2011-01-16 LAB — CBC
HCT: 37.6
Hemoglobin: 12.8
MCHC: 33.9
RDW: 14.8

## 2011-01-16 LAB — WET PREP, GENITAL
Trich, Wet Prep: NONE SEEN
Yeast Wet Prep HPF POC: NONE SEEN

## 2011-01-16 LAB — URINALYSIS, ROUTINE W REFLEX MICROSCOPIC
Hgb urine dipstick: NEGATIVE
Nitrite: NEGATIVE
Protein, ur: NEGATIVE
Urobilinogen, UA: 0.2

## 2011-01-16 LAB — POCT PREGNANCY, URINE: Preg Test, Ur: POSITIVE

## 2011-01-16 LAB — RH IMMUNE GLOBULIN WORKUP (NOT WOMEN'S HOSP)

## 2011-01-16 LAB — ABO/RH: ABO/RH(D): A NEG

## 2011-01-17 LAB — CBC
HCT: 37.1
Hemoglobin: 11 — ABNORMAL LOW
Hemoglobin: 12.5
MCHC: 33.5
MCHC: 33.7
MCHC: 34.3
RBC: 3.19 — ABNORMAL LOW
RDW: 14.9
RDW: 15.2
WBC: 11.5 — ABNORMAL HIGH

## 2011-01-17 LAB — POCT URINALYSIS DIP (DEVICE)
Bilirubin Urine: NEGATIVE
Glucose, UA: NEGATIVE
Hgb urine dipstick: NEGATIVE
Ketones, ur: NEGATIVE
Operator id: 297281
Specific Gravity, Urine: 1.015
Specific Gravity, Urine: 1.025
Urobilinogen, UA: 0.2
pH: 5

## 2011-01-18 ENCOUNTER — Emergency Department (INDEPENDENT_AMBULATORY_CARE_PROVIDER_SITE_OTHER): Payer: Self-pay

## 2011-01-18 ENCOUNTER — Encounter (HOSPITAL_BASED_OUTPATIENT_CLINIC_OR_DEPARTMENT_OTHER): Payer: Self-pay | Admitting: Emergency Medicine

## 2011-01-18 ENCOUNTER — Emergency Department (HOSPITAL_BASED_OUTPATIENT_CLINIC_OR_DEPARTMENT_OTHER)
Admission: EM | Admit: 2011-01-18 | Discharge: 2011-01-18 | Disposition: A | Discharge status: Home / Self Care | Payer: Self-pay | Attending: Emergency Medicine | Admitting: Emergency Medicine

## 2011-01-18 DIAGNOSIS — W19XXXA Unspecified fall, initial encounter: Secondary | ICD-10-CM

## 2011-01-18 DIAGNOSIS — I10 Essential (primary) hypertension: Secondary | ICD-10-CM | POA: Insufficient documentation

## 2011-01-18 DIAGNOSIS — M545 Low back pain, unspecified: Secondary | ICD-10-CM

## 2011-01-18 DIAGNOSIS — R0789 Other chest pain: Secondary | ICD-10-CM

## 2011-01-18 DIAGNOSIS — W010XXA Fall on same level from slipping, tripping and stumbling without subsequent striking against object, initial encounter: Secondary | ICD-10-CM | POA: Insufficient documentation

## 2011-01-18 DIAGNOSIS — F17200 Nicotine dependence, unspecified, uncomplicated: Secondary | ICD-10-CM | POA: Insufficient documentation

## 2011-01-18 DIAGNOSIS — Y9229 Other specified public building as the place of occurrence of the external cause: Secondary | ICD-10-CM | POA: Insufficient documentation

## 2011-01-18 DIAGNOSIS — IMO0002 Reserved for concepts with insufficient information to code with codable children: Secondary | ICD-10-CM | POA: Insufficient documentation

## 2011-01-18 LAB — URINALYSIS, ROUTINE W REFLEX MICROSCOPIC
Glucose, UA: NEGATIVE mg/dL
Leukocytes, UA: NEGATIVE
Nitrite: NEGATIVE
Protein, ur: NEGATIVE mg/dL
Urobilinogen, UA: 0.2 mg/dL (ref 0.0–1.0)

## 2011-01-18 LAB — PREGNANCY, URINE: Preg Test, Ur: NEGATIVE

## 2011-01-18 MED ORDER — IBUPROFEN 800 MG PO TABS
800.0000 mg | ORAL_TABLET | Freq: Once | ORAL | Status: AC
  Administered 2011-01-18: 800 mg via ORAL
  Filled 2011-01-18: qty 1

## 2011-01-18 MED ORDER — IBUPROFEN 800 MG PO TABS: 800.0000 mg | ORAL_TABLET | Freq: Three times a day (TID) | ORAL | Status: AC

## 2011-01-18 MED ORDER — HYDROCODONE-ACETAMINOPHEN 5-325 MG PO TABS: 2.0000 | ORAL_TABLET | ORAL | Status: AC | PRN

## 2011-01-18 MED ORDER — HYDROCODONE-ACETAMINOPHEN 5-325 MG PO TABS
2.0000 | ORAL_TABLET | Freq: Once | ORAL | Status: AC
  Administered 2011-01-18: 2 via ORAL
  Filled 2011-01-18: qty 2
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
## 2011-01-18 NOTE — ED Notes (Signed)
Pt reports L sided low back pain with radiation to leg after fall to L side

## 2011-01-20 LAB — POCT URINALYSIS DIP (DEVICE)
Hgb urine dipstick: NEGATIVE
Hgb urine dipstick: NEGATIVE
Nitrite: NEGATIVE
Nitrite: NEGATIVE
Urobilinogen, UA: 0.2
Urobilinogen, UA: 0.2
pH: 5.5
pH: 7

## 2011-01-21 LAB — POCT URINALYSIS DIP (DEVICE)
Bilirubin Urine: NEGATIVE
Bilirubin Urine: NEGATIVE
Hgb urine dipstick: NEGATIVE
Hgb urine dipstick: NEGATIVE
Nitrite: NEGATIVE
Nitrite: NEGATIVE
Specific Gravity, Urine: 1.015
Specific Gravity, Urine: 1.015
pH: 6.5
pH: 7.5

## 2011-01-24 LAB — URINALYSIS, ROUTINE W REFLEX MICROSCOPIC
Bilirubin Urine: NEGATIVE
Glucose, UA: NEGATIVE mg/dL
Hgb urine dipstick: NEGATIVE
Ketones, ur: NEGATIVE mg/dL
Protein, ur: NEGATIVE mg/dL
pH: 6 (ref 5.0–8.0)

## 2011-01-24 LAB — POCT URINALYSIS DIP (DEVICE)
Bilirubin Urine: NEGATIVE
Glucose, UA: NEGATIVE mg/dL
Hgb urine dipstick: NEGATIVE
Hgb urine dipstick: NEGATIVE
Hgb urine dipstick: NEGATIVE
Ketones, ur: NEGATIVE mg/dL
Ketones, ur: NEGATIVE mg/dL
Nitrite: NEGATIVE
Protein, ur: 30 mg/dL — AB
Protein, ur: NEGATIVE mg/dL
Specific Gravity, Urine: 1.015 (ref 1.005–1.030)
Specific Gravity, Urine: 1.015 (ref 1.005–1.030)
Specific Gravity, Urine: 1.02 (ref 1.005–1.030)
Urobilinogen, UA: 0.2 mg/dL (ref 0.0–1.0)
Urobilinogen, UA: 0.2 mg/dL (ref 0.0–1.0)
pH: 6 (ref 5.0–8.0)
pH: 7 (ref 5.0–8.0)

## 2011-01-24 LAB — COMPREHENSIVE METABOLIC PANEL
Albumin: 2.7 g/dL — ABNORMAL LOW (ref 3.5–5.2)
Alkaline Phosphatase: 136 U/L — ABNORMAL HIGH (ref 39–117)
BUN: 6 mg/dL (ref 6–23)
Creatinine, Ser: 0.44 mg/dL (ref 0.4–1.2)
Glucose, Bld: 98 mg/dL (ref 70–99)
Potassium: 3.7 mEq/L (ref 3.5–5.1)
Total Bilirubin: 0.3 mg/dL (ref 0.3–1.2)
Total Protein: 6.3 g/dL (ref 6.0–8.3)

## 2011-01-24 LAB — CBC
HCT: 31.7 % — ABNORMAL LOW (ref 36.0–46.0)
Hemoglobin: 10.9 g/dL — ABNORMAL LOW (ref 12.0–15.0)
MCV: 90.8 fL (ref 78.0–100.0)
Platelets: 323 10*3/uL (ref 150–400)
RDW: 13.7 % (ref 11.5–15.5)

## 2011-01-24 LAB — URIC ACID: Uric Acid, Serum: 5.3 mg/dL (ref 2.4–7.0)

## 2011-01-29 ENCOUNTER — Other Ambulatory Visit: Payer: Self-pay | Admitting: *Deleted

## 2011-01-29 NOTE — Telephone Encounter (Addendum)
Pt called and requested her monthly rx of Percocet. She will be going out of town sometime today or Thursday. She would like to pick up the rx today if possible. Will send to Dr. Macon Large for review.  Prescription written and printed out; patient will be called and told to pick up prescription.

## 2011-01-30 MED ORDER — OXYCODONE-ACETAMINOPHEN 5-325 MG PO TABS: 2.0000 | ORAL_TABLET | Freq: Four times a day (QID) | ORAL | Status: DC | PRN

## 2011-01-30 NOTE — Telephone Encounter (Signed)
Pt notified. Her husband Viviann Spare will pick up rx in the morning.

## 2011-02-24 ENCOUNTER — Telehealth: Payer: Self-pay | Admitting: *Deleted

## 2011-02-24 NOTE — Telephone Encounter (Signed)
Pt left message stating that she has had a death in the family. She will be going out of town for a month and would like to pick up her monthly pain med Rx tomorrow. I discussed the pt's request with Dr. Macon Large. She would like the following plan:  Tell pt that we need to adhere to the pain contract and therefore, we will mail the Rx so that it will arrive by 11/12-11/13. Also, pt needs a follow up appt soon to discuss plan of care as we cannot continue narcotic Rx indefinitely. I spoke w/pt and informed her of my discussion w/Dr. Macon Large. Pt stated that she understood the situation, however she will not be able to [place] her Rx out of state in [place].  I then offered that pt could have her husband pick up Rx @ clinic next week and mail it to her. Pt stated that will work out fine for her- so I said he may pick up the Rx on 03/03/11. I also talked w/pt about the need for follow up appt for IUD insertion or re-eval of plan of care for the endometriosis. Pt stated that she does not have the money for the clinic fees for IUD insertion-(she has been approved for free IUD which has been received.) I gave her an appt for 04/03/11 w/Dr. Macon Large. Pt voiced understanding and agreement.

## 2011-03-04 ENCOUNTER — Other Ambulatory Visit: Payer: Self-pay | Admitting: Obstetrics & Gynecology

## 2011-03-04 MED ORDER — OXYCODONE-ACETAMINOPHEN 5-325 MG PO TABS: 2.0000 | ORAL_TABLET | Freq: Four times a day (QID) | ORAL | Status: DC | PRN

## 2011-04-03 ENCOUNTER — Ambulatory Visit (INDEPENDENT_AMBULATORY_CARE_PROVIDER_SITE_OTHER): Payer: Self-pay | Admitting: Obstetrics & Gynecology

## 2011-04-03 ENCOUNTER — Encounter: Payer: Self-pay | Admitting: Obstetrics & Gynecology

## 2011-04-03 DIAGNOSIS — R102 Pelvic and perineal pain: Secondary | ICD-10-CM

## 2011-04-03 DIAGNOSIS — G8929 Other chronic pain: Secondary | ICD-10-CM

## 2011-04-03 DIAGNOSIS — N809 Endometriosis, unspecified: Secondary | ICD-10-CM

## 2011-04-03 DIAGNOSIS — N949 Unspecified condition associated with female genital organs and menstrual cycle: Secondary | ICD-10-CM

## 2011-04-03 MED ORDER — OXYCODONE-ACETAMINOPHEN 5-325 MG PO TABS: 2.0000 | ORAL_TABLET | Freq: Four times a day (QID) | ORAL | Status: DC | PRN

## 2011-04-03 NOTE — Patient Instructions (Signed)
Hysterectomy A hysterectomy is a procedure where your womb (uterus) is surgically taken out. It will no longer be possible to have menstrual periods or to become pregnant. Removal of the tubes and ovaries (bilateral salpingo-oopherectomy) can be done during this operation as well.   An abdominal hysterectomy is done through a large cut (incision) in the abdomen made by the surgeon.   A vaginal hysterectomy is done through the vagina. There are no abdominal incisions, but there will be incisions on the inside of the vagina.   A laparoscopic assisted vaginal hysterectomy is done through 2 or 3 small incisions in the abdomen, but the uterus is removed and passed through the vagina.  Women who are going to have a hysterectomy should be tested first to make sure there is no cancer of the cervix or in the uterus. INDICATIONS FOR HYSTERECTOMY:  Persistent abnormal bleeding.   Lasting (chronic) pelvic pain.   Endometriosis. This is when the lining of the uterus (endometrium) is misplaced outside of its normal location.   Adenomyosis. This is when the endometrium tissue grows in the muscle of the uterus.   Uterine prolapse. This is when the uterus falls down into the vagina.   Cancer of the uterus or cervix that requires a radical hysterectomy, removal of the uterus, tubes, ovaries, and surrounding lymph nodes.  LET YOUR CAREGIVER KNOW ABOUT:  Allergies (especially to medicines).   Medications taken including herbs, eye drops, over the counter medications, and creams.   Use of steroids (by mouth or creams).   Past problems with anesthetics or numbing medication.   Possibility of pregnancy, if this applies.   History of blood clots (thrombophlebitis).   History of bleeding or blood problems.   Past surgery.   Other health problems.  RISKS AND COMPLICATIONS All surgeries can have risks. Some of these risks are:  A lot of bleeding.   Injury to surrounding organs.   Infection.    Blood clots of the leg, heart, or lung.   Problems with anesthesia.   Early menopause.  BEFORE THE PROCEDURE  Do not take aspirin or blood thinners for a week before surgery, or as directed by your caregiver.   Do not eat or drink anything after midnight the night before surgery, or as directed by your caregiver.   Let your caregiver know if you get a cold or other infectious problems before surgery.   If you are being admitted the day of surgery, you should be present 60 minutes before your procedure or as told by your caregiver.  PROCEDURE   An IV (intravenous) will be placed in your arm. You will be given a drug to make you sleep (anesthetic) during surgery. You may be given a shot in the spine (spinal anesthesia) that will numb your body from the waist down. This will keep you pain-free during surgery.   When you wake from surgery, you will have the IV and a long, narrow, hollow tube (urinary catheter) draining the bladder for 1 or 2 days after surgery. This will make passing your urine easier. It also helps by keeping your bladder empty during surgery.   After surgery, you will be taken to the recovery area where a nurse will watch and check your progress. Once you wake up, stable and taking fluids well, without other problems, you will be allowed to return to your room. Usually you will remain in the hospital 3 to 5 days. You may be given an antibiotic during and after   the surgery and when you go home. Pain medication will be ordered by your caregiver while you are in the hospital and when you go home.  HOME CARE INSTRUCTIONS  Healing will take time. You will have discomfort, tenderness, swelling, and bruising at the operative site for a couple of weeks. This is normal and will get better as time goes on.   Only take over-the-counter or prescription medicines for pain, discomfort, or fever as directed by your caregiver.   Do not take aspirin. It can cause bleeding.   Do not  drive when taking pain medication.   Follow your caregiver's advice regarding diet, exercise, lifting, driving, and general activities.   Resume your usual diet as directed and allowed.   Get plenty of rest and sleep.   Do not douche, use tampons, or have sexual intercourse until your caregiver gives you permission.   Change your bandages (dressings) as directed.   Take your temperature twice a day. Write it down.   Your caregiver may recommend showers instead of baths for a few weeks.   Do not drink alcohol until your caregiver gives you permission.   If you develop constipation, you may take a mild laxative with your caregiver's permission. Bran foods and drinking fluids helps with constipation problems.   Try to have someone home with you for a week or two to help with the household activities.   Make sure you and your family understands everything about your operation and recovery.   Do not sign any legal documents until you feel normal again.   Keep all your follow-up appointments as recommended by your caregiver.  SEEK MEDICAL CARE IF:   There is swelling, redness, or increasing pain in the wound area.   Pus is coming from the wound.   You notice a bad smell from the wound or surgical dressing.   You have pain, redness, and swelling from the intravenous site.   The wound is breaking open (the edges are not staying together).   You feel dizzy or feel like fainting.   You develop pain or bleeding when you urinate.   You develop diarrhea.   You develop nausea and vomiting.   You develop abnormal vaginal discharge.   You develop a rash.   You have any type of abnormal reaction or develop an allergy to your medication.   You need stronger pain medication for your pain.  SEEK IMMEDIATE MEDICAL CARE IF:  You have a fever.   You develop abdominal pain.   You develop chest pain.   You develop shortness of breath.   You pass out.   You develop pain,  swelling, or redness of your leg.   You develop heavy vaginal bleeding with or without blood clots.  Document Released: 10/01/2000 Document Revised: 12/18/2010 Document Reviewed: 08/19/2007 [facility] Patient Information 2012 [place], [place].   Hormonal Therapy for Women, Frequently Asked Questions WHAT IS HORMONE THERAPY? Hormone therapy (HT), estrogen and progesterone, provides women with the female hormones that decrease and are lost as women get older. When the hormone estrogen is given alone, it is usually referred to as "ERT." When the hormone progesterone is combined with estrogen, it is generally called "HT." Previously this was known as hormone replacement therapy (HRT). Estrogen is a female hormone that brings about changes in various organs in the body. Progesterone is a female hormone that prepares the uterus for a pregnancy each month. During the change-over to menopause ("perimenopause") these hormone levels start to decrease.  This causes many uncomfortable symptoms (see below). When the ovaries stop producing estrogen and progesterone, menstrual periods come to an end. At this point, the woman has experienced menopause. Menopause is complete when a woman misses 12 consecutive menstrual periods. WHAT ARE THE BENEFITS OF HORMONE THERAPY? Hormone therapy has been used to relieve the short-term symptoms of menopause. These include:  Hot flashes.   Depression.   Memory loss.   Correcting irregular menstrual periods.   Night sweats.   Tiredness.   Mood disturbances.   Thinning of scalp hair.   Disturbed sleep.   Vaginal dryness.   Painful intercourse.   Loss of breast tissue.  Evidence shows that HT may be helpful in preventing colon cancer and bone loss (osteoporosis). WHAT ARE THE SHORT-TERM RISKS OF HORMONE THERAPY?  Some women report side effects from taking Hormone Therapy, including:   Feeling sick to stomach (nausea).   Fluid retention.   Swollen breasts.    Acne, when taking HT with progesterone.   Unusual vaginal discharge and bleeding (if the uterus is present).   Headaches.   Some women think HT will make them gain weight. Research now shows this is not true. Some women do gain weight during menopause, but this is because their metabolism slows down as they age. They also may not be increasing their amount or level of physical activity as they get older.   Short-term benefits or side effects should become noticeable within days, weeks, or sometimes months after treatment begins.  LONG-TERM RISKS These will not be easily noticeable for each individual woman. There are many factors involved that can contribute to long-term risks and side effects. CANCER There is concern that HT can increase the risk of some cancers, including endometrial cancer (lining of the uterus), breast, and certain (but not all) ovarian or cervix cancers, such as endometriod ovarian cancer.  When estrogen is taken alone, it raises the risk of endometrial cancer, if the uterus is still present. Adding progestin with estrogen (HT) can greatly reduce this risk. Progestin is added to prevent the overgrowth (hyperplasia) of cells in the uterine lining. Women who still have an intact uterus are generally given this combined therapy and should not take estrogen hormone alone without progesterone. HT with estrogen and progestin has been linked to an increased risk of invasive breast cancer. Women who use estrogen plus progestin for four years or longer are more likely to develop breast cancer than women who have not used them for as long. This indicates that the therapy may have a cumulative effect. The decision to take HT should be based on an overall look at the risk and benefits, and how they fit with your personal and genetic health profile. Conditions that increase the underlying risk of developing breast cancer include:  Family history of breast cancer.   Early age of the  first menstrual period (menarche).   Late age of child bearing.   High fat diet.   Late menopause.   Obesity.   Increased breast density on mammograms.   Certain non-cancerous (benign) breast lesions.   Excessive use of alcohol.   Extensive radiation exposure to the chest.  These factors need to be considered when deciding to take HT. If you are currently taking HT and have concerns, talk with your caregiver as soon as possible.  BREAST DENSITY Taking both estrogen and progestin also can affect a woman's breast density. Increased breast density from HT makes it hard for a radiologist to read some  special breast x-rays (mammograms). This leads to the need for follow-up mammograms, ultrasound or MRI (magnetic resonance imaging), or taking breast tissue samples that are surgically removed (biopsies). Increased density also is a concern because studies have shown that women age 98 and older, whose mammograms show at least 75 percent dense tissue, are at increased risk for breast cancer. However, it is not known if increased breast density due to HT carries the same risk for breast cancer as having naturally dense breasts. About 25 percent of women who use combined HT have an increase in breast density on their mammograms. This is compared to about 8 percent of women taking estrogen alone. One study showed that stopping HT for about 2 weeks before having a mammogram improved the readability of the mammogram. But further research is needed to confirm the usefulness of this approach. HEART DISEASE In the past, taking HT (estrogen plus progestin) was thought to help protect women against heart disease. However, recent findings show that taking HT poses more risks than benefits. HT could increase a woman's risk for:  Heart disease.   Stroke.   Blood clot in the lung (pulmonary embolism).   Breast cancer.   Blood clots in the legs.  Women who have gone through menopause should not be given HT  to prevent heart disease and other chronic conditions.  Women who have gone through menopause and who have heart disease, may have a greater risk of another cardiac event (like heart attack) after starting HT, at least in the short-term. For women who have had strokes, their risk for having another stroke goes up when they start taking HT. Hormones are not recommended for women with heart disease or for women who have had a stroke. If you have gone through menopause, talk with your caregiver about whether hormones are right for you. You can check the [facility] website (http://hoffman.com/) for updates on postmenopausal hormone therapy. OTHER RISKS INCLUDE:  Developing high blood pressure.   Developing gallbladder disease.   Women with a fibroid non-cancerous tumor on the uterus may develop pain, bleeding or increase growth of the fibroid.  If you are taking HT, watch for signs of trouble. These include:  Abnormal bleeding.   Breast lumps, bloody discharge or red/painful breasts.   Shortness of breath.   Dizziness.   Abdominal pain.   Severe headaches.   Pain in your calves or chest.  Report these signs to your caregiver right away. Also, talk with your caregiver about how often you should have an exam. DOES THE DURATION OF TAKING HT AFFECT BREAST CANCER RISK? The relationship between a woman's risk of developing breast cancer and the length of time that she receives HT is not clear. Some women take HT for only a few years until the worst of their menopausal symptoms have passed. Others have taken it for 10 years or more. Some researchers believe that there is little or no increased risk of breast cancer associated with short-term use of either HT with estrogen alone or estrogen combined with progestin. But long-term use is linked to an increased risk. Women on HT should continue to do monthly self breast exams and get their mammograms as recommended by  their caregiver. WHY IS MENOPAUSAL HORMONE THERAPY USED IN SPITE OF THE CANCER RISK? The known benefits of HT can improve the quality of life for many women, by reducing uncomfortable symptoms, as mentioned above. There also is evidence that HT helps prevent and treats osteoporosis.  There is preliminary evidence that it can help prevent other problems associated with age, including colon cancer. The addition of progestin to the treatment has greatly reduced the risk of uterine cancer. ARE THERE OTHER DRUG THERAPIES KNOWN TO TREAT CONDITIONS RELATED TO MENOPAUSE? A class of antidepressant drugs called Selective Serotonin Reuptake Inhibitors (SSRIs) are effective in treating menopause-related symptoms of depression or mood changes. Vitamin E and Clonidine (drug typically used for high blood pressure) can help reduce hot flashes. To prevent osteoporosis, women who are at high risk for bone loss may be given drugs such as bisphosphonates, alendronate, raloxifene, calcium with vitamin D, calcitonin, and prescription medicines such as fasomax or boneva. Lastly, a class of cholesterol-lowering drugs called HMG-CoA-reductase inhibitors (statins) are proven to be effective for reducing risk of heart disease. They are also being explored to prevent osteoporosis. No alternatives to estrogen exist for prevention of colon cancer - a disease for which early evidence suggests HT may be beneficial. WHO SHOULD NOT USE HT?  HT is often not recommended for women who have any of the following conditions:  Vaginal bleeding of an unknown cause.   Suspected breast cancer or history of breast cancer.   History of endometrial or uterine cancer.   Chronic disease of the liver.   History of heart disease.   History of blood clots in the veins or legs or in the lung (venous thrombosis). This includes women who have had thrombosis or blood clots during pregnancy or when taking birth control pills. Although the risk of blood  clots in women is very low, HT increases the risk.   Severe or uncontrolled high blood pressure.   Anyone who may be pregnant.  HOW CAN I SORT THROUGH THE BENEFITS AND RISKS TO MAKE A GOOD DECISION ABOUT WHETHER OR NOT TO USE POSTMENOPAUSAL HORMONE THERAPY? Here are several helpful points, summarizing the findings of the [facility]) study:  First, it is important to know that because the study involved healthy women, only a small number of them had either a negative or positive effect from estrogen plus progestin therapy. The percentages describe what would happen to a whole population, not necessarily to any individual woman. Second, remember that percentages are not fate. Whether expressing risks or benefits, a percentage does not mean you will develop a disease. Many factors affect that likelihood, including:  Your lifestyle.   Environmental factors.   Heredity.   Your personal medical history.  Realize that most treatments carry risks and benefits. No one can make a treatment choice for you. Talk with your caregiver and decide what is best for your health and quality of life. Begin by finding out your family history and your personal risk profile for:  Heart disease.   Stroke.   Breast cancer.   Osteoporosis.   Colorectal cancer.   Blood clots.   Other medical conditions.  Document Released: 01/04/2003 Document Revised: 12/18/2010 Document Reviewed: 02/05/2009 [facility] Patient Information 2012 [place], [place].

## 2011-04-03 NOTE — Progress Notes (Signed)
Patient does not want to get IUD today, because cannot afford insertion fees and plans to have surgery soon also. C/o period is very heavy- states using superplus tampon and pad and has to change every 30 minutes

## 2011-04-03 NOTE — Progress Notes (Addendum)
History:  25 y.o. G1P1 here today for discussion about management of endometriosis.  She desires definitive surgical management in the form of hysterectomy and bilateral salpingo-oophorectomy.  She says she cannot take the pain anymore.  Declines any other treatment modalities.  The following portions of the patient's history were reviewed and updated as appropriate: allergies, current medications, past family history, past medical history, past social history, past surgical history and problem list. Last pap smear was in 2010 and was normal.   Objective:  Physical Exam Blood pressure 130/97, pulse 98, temperature 97.6 F (36.4 C), temperature source Oral, height 5\' 3"  (1.6 m), weight 145 lb 8 oz (65.998 kg), last menstrual period 04/01/2011. Exam deferred as per patient request   Assessment & Plan:  Patient desires definitive management with hysterectomy.  I proposed doing a total laparoscopic hysterectomy vs laparoscopic-assisted vaginal hysterectomy and bilateral salpingo-oophorectomy. Counseled about the need for hormone replacement therapy.  The risks of surgery were discussed in detail with the patient including but not limited to: bleeding which may require transfusion or reoperation; infection which may require antibiotics; injury to bowel, bladder, ureters or other surrounding organs; need for additional procedures including laparotomy; thromboembolic phenomenon, incisional problems and other postoperative/anesthesia complications.    Likelihood of success in alleviating the patient's symptoms was discussed. Routine postoperative instructions will be reviewed with the patient and her family in detail after surgery.  She was told that she will be contacted by our surgical scheduler regarding the time and date of her surgery; routine preoperative instructions of having nothing to eat or drink after midnight on the day prior to surgery and also coming to the hospital 1 1/2 hours prior to her time  of surgery were also emphasized. She was told she may be called for a preoperative appointment about a week prior to surgery and will be given further preoperative instructions at that visit. In the meantime, she will continue Percocet prn pain; pain and bleeding precautions were reviewed.   Patient also wanted referral to Mental Health, she was given the number to [facility] and will call them to make appointment

## 2011-04-04 ENCOUNTER — Ambulatory Visit (HOSPITAL_COMMUNITY): Payer: Self-pay | Attending: Obstetrics & Gynecology

## 2011-04-19 ENCOUNTER — Emergency Department (HOSPITAL_COMMUNITY)
Admission: EM | Admit: 2011-04-19 | Discharge: 2011-04-19 | Disposition: A | Discharge status: Home / Self Care | Payer: Self-pay | Attending: Emergency Medicine | Admitting: Emergency Medicine

## 2011-04-19 ENCOUNTER — Encounter (HOSPITAL_COMMUNITY): Payer: Self-pay | Admitting: *Deleted

## 2011-04-19 ENCOUNTER — Emergency Department (HOSPITAL_COMMUNITY): Payer: Self-pay

## 2011-04-19 DIAGNOSIS — R0602 Shortness of breath: Secondary | ICD-10-CM | POA: Insufficient documentation

## 2011-04-19 DIAGNOSIS — J351 Hypertrophy of tonsils: Secondary | ICD-10-CM | POA: Insufficient documentation

## 2011-04-19 DIAGNOSIS — I10 Essential (primary) hypertension: Secondary | ICD-10-CM | POA: Insufficient documentation

## 2011-04-19 DIAGNOSIS — R079 Chest pain, unspecified: Secondary | ICD-10-CM | POA: Insufficient documentation

## 2011-04-19 DIAGNOSIS — J069 Acute upper respiratory infection, unspecified: Secondary | ICD-10-CM | POA: Insufficient documentation

## 2011-04-19 DIAGNOSIS — R042 Hemoptysis: Secondary | ICD-10-CM | POA: Insufficient documentation

## 2011-04-19 LAB — RAPID STREP SCREEN (MED CTR MEBANE ONLY): Streptococcus, Group A Screen (Direct): NEGATIVE

## 2011-04-19 MED ORDER — HYDROCOD POLST-CHLORPHEN POLST 10-8 MG/5ML PO LQCR
5.0000 mL | Freq: Once | ORAL | Status: AC
  Administered 2011-04-19: 5 mL via ORAL
  Filled 2011-04-19: qty 5

## 2011-04-19 MED ORDER — SODIUM CHLORIDE 0.9 % IV BOLUS (SEPSIS)
1000.0000 mL | Freq: Once | INTRAVENOUS | Status: AC
  Administered 2011-04-19: 1000 mL via INTRAVENOUS

## 2011-04-19 MED ORDER — KETOROLAC TROMETHAMINE 30 MG/ML IJ SOLN
30.0000 mg | Freq: Once | INTRAMUSCULAR | Status: AC
  Administered 2011-04-19: 30 mg via INTRAVENOUS
  Filled 2011-04-19: qty 1

## 2011-04-19 MED ORDER — METOCLOPRAMIDE HCL 5 MG/ML IJ SOLN
10.0000 mg | Freq: Once | INTRAMUSCULAR | Status: AC
  Administered 2011-04-19: 10 mg via INTRAVENOUS
  Filled 2011-04-19: qty 2

## 2011-04-19 MED ORDER — HYDROCOD POLST-CHLORPHEN POLST 10-8 MG/5ML PO LQCR: 5.0000 mL | Freq: Two times a day (BID) | ORAL | Status: DC

## 2011-04-19 NOTE — ED Notes (Signed)
MD at bedside. 

## 2011-04-19 NOTE — ED Notes (Signed)
Pt in c/o cough and congestion, states she has been coughing up blood, fever

## 2011-04-19 NOTE — ED Provider Notes (Signed)
Medical screening examination/treatment/procedure(s) were performed by non-physician practitioner and as supervising physician I was immediately available for consultation/collaboration.   Lyanne Co, MD 04/19/11 [phone]
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
## 2011-05-01 ENCOUNTER — Ambulatory Visit (INDEPENDENT_AMBULATORY_CARE_PROVIDER_SITE_OTHER): Payer: Self-pay | Admitting: Obstetrics & Gynecology

## 2011-05-01 ENCOUNTER — Encounter: Payer: Self-pay | Admitting: Obstetrics & Gynecology

## 2011-05-01 DIAGNOSIS — Z01419 Encounter for gynecological examination (general) (routine) without abnormal findings: Secondary | ICD-10-CM

## 2011-05-01 DIAGNOSIS — N809 Endometriosis, unspecified: Secondary | ICD-10-CM

## 2011-05-01 MED ORDER — MEDROXYPROGESTERONE ACETATE 10 MG PO TABS: 10.0000 mg | ORAL_TABLET | Freq: Every day | ORAL | Status: DC

## 2011-05-01 MED ORDER — LORAZEPAM 1 MG PO TABS: 1.0000 mg | ORAL_TABLET | Freq: Three times a day (TID) | ORAL | Status: AC

## 2011-05-01 MED ORDER — OXYCODONE-ACETAMINOPHEN 5-325 MG PO TABS: 2.0000 | ORAL_TABLET | Freq: Four times a day (QID) | ORAL | Status: DC | PRN

## 2011-05-01 NOTE — Progress Notes (Signed)
  Subjective:     Rachel Mcdaniel is a 26 y.o. female and is here for a comprehensive physical exam prior to surgery.  She is scheduled for hysterectomy on 05/19/11 for endometriosis.  Reports increased menstrual bleeding.  History   Social History  . Marital Status: Single    Spouse Name: N/A    Number of Children: N/A  . Years of Education: N/A   Occupational History  . Not on file.   Social History Main Topics  . Smoking status: Current Everyday Smoker    Types: Cigarettes  . Smokeless tobacco: Never Used   Comment: down to 1 cig per day  . Alcohol Use: No  . Drug Use: No  . Sexually Active: Yes    Birth Control/ Protection: Condom   Other Topics Concern  . Not on file   Social History Narrative  . No narrative on file   Health Maintenance  Topic Date Due  . Pap Smear  06/24/2003  . Tetanus/tdap  06/23/2004  . Influenza Vaccine  01/20/2011    The following portions of the patient's history were reviewed and updated as appropriate: allergies, current medications, past family history, past medical history, past social history, past surgical history and problem list.  Review of Systems Reports anxiety, wants anxiolytic   Objective:  Blood pressure 122/87, pulse 87, temperature 98.6 F (37 C), temperature source Oral, height 5\' 3"  (1.6 m), weight 150 lb 12.8 oz (68.402 kg), last menstrual period 04/01/2011. GENERAL: Well-developed, well-nourished female in no acute distress.  HEENT: Normocephalic, atraumatic. Sclerae anicteric.  NECK: Supple. Normal thyroid.  LUNGS: Clear to auscultation bilaterally.  HEART: Regular rate and rhythm. BREASTS: Symmetric with everted nipples. No abnormal masses, skin changes, nipple drainage, or lymphadenopathy. ABDOMEN: Soft, nontender, nondistended. No organomegaly. PELVIC: Normal external female genitalia. Vagina is pink and rugated.  Normal discharge. Normal cervix contour. Pap smear obtained. Uterus is normal in size. EXTREMITIES: No  cyanosis, clubbing, or edema, 2+ distal pulses.   Assessment:     Annual exam Endometriosis   Plan:    Follow up pap smear Ativan, Percocet and Provera Rx given to patient Will proceed with surgery as planned, preoperative instructions reviewed.

## 2011-05-08 ENCOUNTER — Encounter (HOSPITAL_COMMUNITY): Payer: Self-pay | Admitting: Pharmacist

## 2011-05-09 ENCOUNTER — Inpatient Hospital Stay (HOSPITAL_COMMUNITY): Admission: RE | Admit: 2011-05-09 | Payer: Self-pay | Source: Ambulatory Visit

## 2011-05-13 ENCOUNTER — Encounter (HOSPITAL_COMMUNITY): Payer: Self-pay

## 2011-05-13 ENCOUNTER — Encounter (HOSPITAL_COMMUNITY)
Admission: RE | Admit: 2011-05-13 | Discharge: 2011-05-13 | Disposition: A | Discharge status: Home / Self Care | Payer: Medicaid Other | Source: Ambulatory Visit | Attending: Obstetrics & Gynecology | Admitting: Obstetrics & Gynecology

## 2011-05-13 HISTORY — DX: Anemia, unspecified: D64.9

## 2011-05-13 HISTORY — DX: Depression, unspecified: F32.A

## 2011-05-13 HISTORY — DX: Headache: R51

## 2011-05-13 HISTORY — DX: Major depressive disorder, single episode, unspecified: F32.9

## 2011-05-13 HISTORY — DX: Anxiety disorder, unspecified: F41.9

## 2011-05-13 LAB — CBC
MCH: 29.9 pg (ref 26.0–34.0)
Platelets: 392 10*3/uL (ref 150–400)
RBC: 3.95 MIL/uL (ref 3.87–5.11)

## 2011-05-13 LAB — SURGICAL PCR SCREEN
MRSA, PCR: NEGATIVE
Staphylococcus aureus: NEGATIVE

## 2011-05-13 NOTE — Pre-Procedure Instructions (Signed)
OK to see patient DOS 

## 2011-05-13 NOTE — Patient Instructions (Addendum)
   Your procedure is scheduled on: Monday, Jan 28th  Enter through the Main Entrance of [facility] at: 915am Pick up the phone at the desk and dial [phone] and inform us of your arrival.  Please call this number if you have any problems the morning of surgery: [phone]  Remember: Do not eat food after midnight: Sunday Do not drink clear liquids after: Sunday Take these medicines the morning of surgery with a SIP OF WATER: xanax  Do not wear jewelry, make-up, or FINGER nail polish Do not wear lotions, powders, perfumes or deodorant. Do not shave 48 hours prior to surgery. Do not bring valuables to the hospital.  Leave suitcase in the car. After Surgery it may be brought to your room. For patients being admitted to the hospital, checkout time is 11:00am the day of discharge.  Home with Husband Arvin Collard  cell [phone].  Patients discharged on the day of surgery will not be allowed to drive home.     Remember to use your hibiclens as instructed.Please shower with 1/2 bottle the evening before your surgery and the other 1/2 bottle the morning of surgery.

## 2011-05-18 MED ORDER — DEXTROSE 5 % IV SOLN
2.0000 g | INTRAVENOUS | Status: AC
  Administered 2011-05-19: 2 g via INTRAVENOUS
  Filled 2011-05-18: qty 2

## 2011-05-19 ENCOUNTER — Encounter (HOSPITAL_COMMUNITY): Payer: Self-pay | Admitting: Anesthesiology

## 2011-05-19 ENCOUNTER — Inpatient Hospital Stay (HOSPITAL_COMMUNITY): Payer: Medicaid Other | Admitting: Anesthesiology

## 2011-05-19 ENCOUNTER — Encounter (HOSPITAL_COMMUNITY)
Admission: RE | Disposition: A | Discharge status: Home / Self Care | Payer: Self-pay | Source: Ambulatory Visit | Attending: Obstetrics & Gynecology

## 2011-05-19 ENCOUNTER — Inpatient Hospital Stay (HOSPITAL_COMMUNITY)
Admission: RE | Admit: 2011-05-19 | Discharge: 2011-05-20 | DRG: 743 | Disposition: A | Discharge status: Home / Self Care | Payer: Medicaid Other | Source: Ambulatory Visit | Attending: Obstetrics & Gynecology | Admitting: Obstetrics & Gynecology

## 2011-05-19 ENCOUNTER — Other Ambulatory Visit: Payer: Self-pay | Admitting: Obstetrics & Gynecology

## 2011-05-19 DIAGNOSIS — Z01818 Encounter for other preprocedural examination: Secondary | ICD-10-CM

## 2011-05-19 DIAGNOSIS — N8000 Endometriosis of the uterus, unspecified: Secondary | ICD-10-CM | POA: Diagnosis present

## 2011-05-19 DIAGNOSIS — N831 Corpus luteum cyst of ovary, unspecified side: Secondary | ICD-10-CM | POA: Diagnosis present

## 2011-05-19 DIAGNOSIS — N809 Endometriosis, unspecified: Secondary | ICD-10-CM | POA: Diagnosis present

## 2011-05-19 DIAGNOSIS — Z01812 Encounter for preprocedural laboratory examination: Secondary | ICD-10-CM

## 2011-05-19 DIAGNOSIS — N803 Endometriosis of pelvic peritoneum, unspecified: Principal | ICD-10-CM | POA: Diagnosis present

## 2011-05-19 DIAGNOSIS — Z90710 Acquired absence of both cervix and uterus: Secondary | ICD-10-CM | POA: Diagnosis present

## 2011-05-19 DIAGNOSIS — N8300 Follicular cyst of ovary, unspecified side: Secondary | ICD-10-CM | POA: Diagnosis present

## 2011-05-19 HISTORY — PX: SALPINGOOPHORECTOMY: SHX82

## 2011-05-19 HISTORY — PX: LAPAROSCOPIC HYSTERECTOMY: SHX1926

## 2011-05-19 HISTORY — PX: CYSTOSCOPY: SHX5120

## 2011-05-19 LAB — PREGNANCY, URINE: Preg Test, Ur: NEGATIVE

## 2011-05-19 LAB — TYPE AND SCREEN: ABO/RH(D): A NEG

## 2011-05-19 SURGERY — HYSTERECTOMY, TOTAL, LAPAROSCOPIC
Anesthesia: General | Site: Bladder | Wound class: Clean Contaminated

## 2011-05-19 MED ORDER — ROCURONIUM BROMIDE 100 MG/10ML IV SOLN
INTRAVENOUS | Status: DC | PRN
  Administered 2011-05-19: 5 mg via INTRAVENOUS
  Administered 2011-05-19: 10 mg via INTRAVENOUS
  Administered 2011-05-19: 40 mg via INTRAVENOUS
  Administered 2011-05-19: 5 mg via INTRAVENOUS
  Administered 2011-05-19: 10 mg via INTRAVENOUS

## 2011-05-19 MED ORDER — LACTATED RINGERS IR SOLN
Status: DC | PRN
  Administered 2011-05-19: 3000 mL

## 2011-05-19 MED ORDER — ACETAMINOPHEN 325 MG PO TABS: 325.0000 mg | ORAL_TABLET | ORAL | Status: DC | PRN

## 2011-05-19 MED ORDER — FENTANYL CITRATE 0.05 MG/ML IJ SOLN
INTRAMUSCULAR | Status: AC
  Administered 2011-05-19: 50 ug via INTRAVENOUS
  Filled 2011-05-19: qty 2

## 2011-05-19 MED ORDER — HYDROMORPHONE HCL PF 1 MG/ML IJ SOLN
INTRAMUSCULAR | Status: AC
  Filled 2011-05-19: qty 1

## 2011-05-19 MED ORDER — FENTANYL CITRATE 0.05 MG/ML IJ SOLN
INTRAMUSCULAR | Status: AC
  Filled 2011-05-19: qty 5

## 2011-05-19 MED ORDER — ONDANSETRON HCL 4 MG/2ML IJ SOLN
INTRAMUSCULAR | Status: AC
  Filled 2011-05-19: qty 2

## 2011-05-19 MED ORDER — MENTHOL 3 MG MT LOZG: 1.0000 | LOZENGE | OROMUCOSAL | Status: DC | PRN

## 2011-05-19 MED ORDER — ONDANSETRON HCL 4 MG PO TABS: 4.0000 mg | ORAL_TABLET | Freq: Four times a day (QID) | ORAL | Status: DC | PRN

## 2011-05-19 MED ORDER — LIDOCAINE HCL (CARDIAC) 20 MG/ML IV SOLN
INTRAVENOUS | Status: AC
  Filled 2011-05-19: qty 5

## 2011-05-19 MED ORDER — PROMETHAZINE HCL 25 MG/ML IJ SOLN: 6.2500 mg | INTRAMUSCULAR | Status: DC | PRN

## 2011-05-19 MED ORDER — DEXAMETHASONE SODIUM PHOSPHATE 10 MG/ML IJ SOLN
INTRAMUSCULAR | Status: AC
  Filled 2011-05-19: qty 1

## 2011-05-19 MED ORDER — LIDOCAINE HCL (CARDIAC) 20 MG/ML IV SOLN
INTRAVENOUS | Status: DC | PRN
  Administered 2011-05-19: 60 mg via INTRAVENOUS

## 2011-05-19 MED ORDER — KETOROLAC TROMETHAMINE 30 MG/ML IJ SOLN
INTRAMUSCULAR | Status: DC | PRN
  Administered 2011-05-19: 30 mg via INTRAVENOUS

## 2011-05-19 MED ORDER — NEOSTIGMINE METHYLSULFATE 1 MG/ML IJ SOLN
INTRAMUSCULAR | Status: AC
  Filled 2011-05-19: qty 10

## 2011-05-19 MED ORDER — PROPOFOL 10 MG/ML IV EMUL
INTRAVENOUS | Status: DC | PRN
  Administered 2011-05-19: 200 mg via INTRAVENOUS

## 2011-05-19 MED ORDER — LACTATED RINGERS IV SOLN: INTRAVENOUS | Status: DC

## 2011-05-19 MED ORDER — ZOLPIDEM TARTRATE 5 MG PO TABS
5.0000 mg | ORAL_TABLET | Freq: Every evening | ORAL | Status: DC | PRN
  Administered 2011-05-20: 10 mg via ORAL
  Filled 2011-05-19: qty 2

## 2011-05-19 MED ORDER — GLYCOPYRROLATE 0.2 MG/ML IJ SOLN
INTRAMUSCULAR | Status: AC
  Filled 2011-05-19: qty 2

## 2011-05-19 MED ORDER — KETOROLAC TROMETHAMINE 30 MG/ML IJ SOLN: 15.0000 mg | Freq: Once | INTRAMUSCULAR | Status: DC | PRN

## 2011-05-19 MED ORDER — FENTANYL CITRATE 0.05 MG/ML IJ SOLN
INTRAMUSCULAR | Status: DC | PRN
  Administered 2011-05-19 (×2): 50 ug via INTRAVENOUS
  Administered 2011-05-19: 25 ug via INTRAVENOUS
  Administered 2011-05-19 (×2): 100 ug via INTRAVENOUS
  Administered 2011-05-19: 25 ug via INTRAVENOUS
  Administered 2011-05-19: 50 ug via INTRAVENOUS
  Administered 2011-05-19: 100 ug via INTRAVENOUS

## 2011-05-19 MED ORDER — BUPIVACAINE HCL (PF) 0.5 % IJ SOLN
INTRAMUSCULAR | Status: AC
  Filled 2011-05-19: qty 30

## 2011-05-19 MED ORDER — DEXAMETHASONE SODIUM PHOSPHATE 10 MG/ML IJ SOLN
INTRAMUSCULAR | Status: DC | PRN
  Administered 2011-05-19: 10 mg via INTRAVENOUS

## 2011-05-19 MED ORDER — PROMETHAZINE HCL 25 MG/ML IJ SOLN
12.5000 mg | INTRAMUSCULAR | Status: DC | PRN
  Administered 2011-05-19 – 2011-05-20 (×3): 12.5 mg via INTRAVENOUS
  Filled 2011-05-19 (×3): qty 1

## 2011-05-19 MED ORDER — FENTANYL CITRATE 0.05 MG/ML IJ SOLN
INTRAMUSCULAR | Status: AC
  Filled 2011-05-19: qty 2

## 2011-05-19 MED ORDER — HYDROMORPHONE HCL PF 1 MG/ML IJ SOLN
1.0000 mg | INTRAMUSCULAR | Status: DC | PRN
  Administered 2011-05-19 – 2011-05-20 (×3): 1 mg via INTRAVENOUS
  Filled 2011-05-19 (×3): qty 1

## 2011-05-19 MED ORDER — ROCURONIUM BROMIDE 50 MG/5ML IV SOLN
INTRAVENOUS | Status: AC
  Filled 2011-05-19: qty 1

## 2011-05-19 MED ORDER — OXYCODONE-ACETAMINOPHEN 5-325 MG PO TABS
ORAL_TABLET | ORAL | Status: AC
  Administered 2011-05-19: 2 via ORAL
  Filled 2011-05-19: qty 2

## 2011-05-19 MED ORDER — GLYCOPYRROLATE 0.2 MG/ML IJ SOLN
INTRAMUSCULAR | Status: DC | PRN
  Administered 2011-05-19: .8 mg via INTRAVENOUS
  Administered 2011-05-19: 0.1 mg via INTRAVENOUS

## 2011-05-19 MED ORDER — FENTANYL CITRATE 0.05 MG/ML IJ SOLN
25.0000 ug | INTRAMUSCULAR | Status: DC | PRN
  Administered 2011-05-19 (×3): 50 ug via INTRAVENOUS

## 2011-05-19 MED ORDER — BUPIVACAINE-EPINEPHRINE 0.5% -1:200000 IJ SOLN
INTRAMUSCULAR | Status: DC | PRN
  Administered 2011-05-19: 10 mL

## 2011-05-19 MED ORDER — LACTATED RINGERS IV SOLN
INTRAVENOUS | Status: DC
  Administered 2011-05-19 – 2011-05-20 (×2): via INTRAVENOUS

## 2011-05-19 MED ORDER — HYDROMORPHONE HCL PF 1 MG/ML IJ SOLN
INTRAMUSCULAR | Status: DC | PRN
  Administered 2011-05-19 (×3): 1 mg via INTRAVENOUS

## 2011-05-19 MED ORDER — KETOROLAC TROMETHAMINE 60 MG/2ML IM SOLN
INTRAMUSCULAR | Status: DC | PRN
  Administered 2011-05-19: 30 mg via INTRAMUSCULAR

## 2011-05-19 MED ORDER — HYDROMORPHONE HCL PF 1 MG/ML IJ SOLN: 0.2000 mg | INTRAMUSCULAR | Status: DC | PRN

## 2011-05-19 MED ORDER — ALPRAZOLAM 0.5 MG PO TABS
0.5000 mg | ORAL_TABLET | Freq: Three times a day (TID) | ORAL | Status: DC | PRN
  Administered 2011-05-19: 0.5 mg via ORAL

## 2011-05-19 MED ORDER — PROPOFOL 10 MG/ML IV EMUL
INTRAVENOUS | Status: AC
  Filled 2011-05-19: qty 20

## 2011-05-19 MED ORDER — BUPIVACAINE HCL (PF) 0.25 % IJ SOLN
INTRAMUSCULAR | Status: AC
  Filled 2011-05-19: qty 30

## 2011-05-19 MED ORDER — BUPIVACAINE-EPINEPHRINE (PF) 0.5% -1:200000 IJ SOLN
INTRAMUSCULAR | Status: AC
  Filled 2011-05-19: qty 10

## 2011-05-19 MED ORDER — LACTATED RINGERS IV SOLN
INTRAVENOUS | Status: DC
  Administered 2011-05-19 (×4): via INTRAVENOUS

## 2011-05-19 MED ORDER — ONDANSETRON HCL 4 MG/2ML IJ SOLN
INTRAMUSCULAR | Status: DC | PRN
  Administered 2011-05-19 (×2): 4 mg via INTRAVENOUS

## 2011-05-19 MED ORDER — MIDAZOLAM HCL 5 MG/5ML IJ SOLN
INTRAMUSCULAR | Status: DC | PRN
  Administered 2011-05-19: 2 mg via INTRAVENOUS

## 2011-05-19 MED ORDER — OXYCODONE-ACETAMINOPHEN 5-325 MG PO TABS
1.0000 | ORAL_TABLET | ORAL | Status: DC | PRN
  Administered 2011-05-19 (×3): 2 via ORAL
  Filled 2011-05-19 (×2): qty 2
  Filled 2011-05-19: qty 1

## 2011-05-19 MED ORDER — MIDAZOLAM HCL 2 MG/2ML IJ SOLN
INTRAMUSCULAR | Status: AC
  Filled 2011-05-19: qty 2

## 2011-05-19 MED ORDER — PANTOPRAZOLE SODIUM 40 MG PO TBEC
40.0000 mg | DELAYED_RELEASE_TABLET | Freq: Every day | ORAL | Status: DC
  Filled 2011-05-19 (×2): qty 1

## 2011-05-19 MED ORDER — INDIGOTINDISULFONATE SODIUM 8 MG/ML IJ SOLN
INTRAMUSCULAR | Status: DC | PRN
  Administered 2011-05-19: 5 mL via INTRAVENOUS

## 2011-05-19 MED ORDER — IBUPROFEN 600 MG PO TABS
600.0000 mg | ORAL_TABLET | Freq: Four times a day (QID) | ORAL | Status: DC | PRN
  Administered 2011-05-19: 600 mg via ORAL
  Filled 2011-05-19: qty 1

## 2011-05-19 MED ORDER — NEOSTIGMINE METHYLSULFATE 1 MG/ML IJ SOLN
INTRAMUSCULAR | Status: DC | PRN
  Administered 2011-05-19: 4 mg via INTRAVENOUS

## 2011-05-19 MED ORDER — ONDANSETRON HCL 4 MG/2ML IJ SOLN: 4.0000 mg | Freq: Four times a day (QID) | INTRAMUSCULAR | Status: DC | PRN

## 2011-05-19 MED ORDER — INDIGOTINDISULFONATE SODIUM 8 MG/ML IJ SOLN
INTRAMUSCULAR | Status: AC
  Filled 2011-05-19: qty 5

## 2011-05-19 MED ORDER — KETOROLAC TROMETHAMINE 60 MG/2ML IM SOLN
INTRAMUSCULAR | Status: AC
  Filled 2011-05-19: qty 2

## 2011-05-19 SURGICAL SUPPLY — 60 items
ADH SKN CLS APL DERMABOND .7 (GAUZE/BANDAGES/DRESSINGS) ×4
APPLICATOR COTTON TIP 6IN STRL (MISCELLANEOUS) ×10 IMPLANT
BLADE SURG 15 STRL LF C SS BP (BLADE) ×4 IMPLANT
BLADE SURG 15 STRL SS (BLADE) ×5
CABLE HIGH FREQUENCY MONO STRZ (ELECTRODE) ×2 IMPLANT
CANISTER SUCTION 2500CC (MISCELLANEOUS) ×5 IMPLANT
CHLORAPREP W/TINT 26ML (MISCELLANEOUS) ×5 IMPLANT
CLOTH BEACON ORANGE TIMEOUT ST (SAFETY) ×5 IMPLANT
CONT PATH 16OZ SNAP LID 3702 (MISCELLANEOUS) ×5 IMPLANT
COUNTER NEEDLE 1200 MAGNETIC (NEEDLE) IMPLANT
COVER LIGHT HANDLE  1/PK (MISCELLANEOUS) ×1
COVER LIGHT HANDLE 1/PK (MISCELLANEOUS) ×1 IMPLANT
COVER MAYO STAND STRL (DRAPES) ×5 IMPLANT
COVER TABLE BACK 60X90 (DRAPES) ×5 IMPLANT
DECANTER SPIKE VIAL GLASS SM (MISCELLANEOUS) ×8 IMPLANT
DERMABOND ADVANCED (GAUZE/BANDAGES/DRESSINGS) ×1
DERMABOND ADVANCED .7 DNX12 (GAUZE/BANDAGES/DRESSINGS) ×4 IMPLANT
DEVICE SUTURE ENDOST 10MM (ENDOMECHANICALS) ×2 IMPLANT
DRAPE CAMERA CLOSED 9X96 (DRAPES) IMPLANT
ELECT REM PT RETURN 9FT ADLT (ELECTROSURGICAL) ×5
ELECTRODE REM PT RTRN 9FT ADLT (ELECTROSURGICAL) ×1 IMPLANT
ENDOSTITCH 0 SINGLE 48 (SUTURE) ×12 IMPLANT
EVACUATOR SMOKE 8.L (FILTER) ×5 IMPLANT
GLOVE BIO SURGEON STRL SZ7 (GLOVE) ×10 IMPLANT
GLOVE BIOGEL PI IND STRL 6.5 (GLOVE) ×4 IMPLANT
GLOVE BIOGEL PI IND STRL 7.0 (GLOVE) ×8 IMPLANT
GLOVE BIOGEL PI INDICATOR 6.5 (GLOVE) ×1
GLOVE BIOGEL PI INDICATOR 7.0 (GLOVE) ×2
GOWN PREVENTION PLUS LG XLONG (DISPOSABLE) ×15 IMPLANT
GOWN STRL REIN XL XLG (GOWN DISPOSABLE) ×5 IMPLANT
NDL INSUFFLATION 14GA 120MM (NEEDLE) ×3 IMPLANT
NEEDLE HYPO 22GX1.5 SAFETY (NEEDLE) IMPLANT
NEEDLE INSUFFLATION 14GA 120MM (NEEDLE) ×5 IMPLANT
NEEDLE MAYO .5 CIRCLE (NEEDLE) ×3 IMPLANT
NS IRRIG 1000ML POUR BTL (IV SOLUTION) ×3 IMPLANT
PACK LAPAROSCOPY BASIN (CUSTOM PROCEDURE TRAY) ×3 IMPLANT
PACK LAVH (CUSTOM PROCEDURE TRAY) ×5 IMPLANT
SCALPEL HARMONIC ACE (MISCELLANEOUS) ×5 IMPLANT
SCISSORS LAP 5X35 DISP (ENDOMECHANICALS) IMPLANT
SEALER TISSUE G2 CVD JAW 35 (ENDOMECHANICALS) ×1 IMPLANT
SEALER TISSUE G2 CVD JAW 45CM (ENDOMECHANICALS) ×1 IMPLANT
SET CYSTO W/LG BORE CLAMP LF (SET/KITS/TRAYS/PACK) IMPLANT
SET IRRIG TUBING LAPAROSCOPIC (IRRIGATION / IRRIGATOR) ×5 IMPLANT
SUT VIC AB 0 CT1 18XCR BRD8 (SUTURE) ×9 IMPLANT
SUT VIC AB 0 CT1 36 (SUTURE) ×5 IMPLANT
SUT VIC AB 0 CT1 8-18 (SUTURE)
SUT VIC AB 3-0 X1 27 (SUTURE) ×3 IMPLANT
SUT VICRYL 0 ENDOLOOP (SUTURE) IMPLANT
SUT VICRYL 0 TIES 12 18 (SUTURE) ×5 IMPLANT
SUT VICRYL 0 UR6 27IN ABS (SUTURE) ×6 IMPLANT
SUT VICRYL 4-0 PS2 18IN ABS (SUTURE) ×6 IMPLANT
SUT VICRYL RAPIDE 3 0 (SUTURE) ×5 IMPLANT
TOWEL OR 17X24 6PK STRL BLUE (TOWEL DISPOSABLE) ×15 IMPLANT
TRAY FOLEY CATH 14FR (SET/KITS/TRAYS/PACK) ×5 IMPLANT
TROCAR BALLN 12MMX100 BLUNT (TROCAR) IMPLANT
TROCAR Z-THREAD BLADED 11X100M (TROCAR) IMPLANT
TROCAR Z-THREAD BLADED 5X100MM (TROCAR) ×10 IMPLANT
TROCAR Z-THREAD FIOS 11X100 BL (TROCAR) ×10 IMPLANT
WARMER LAPAROSCOPE (MISCELLANEOUS) ×5 IMPLANT
WATER STERILE IRR 1000ML POUR (IV SOLUTION) ×5 IMPLANT

## 2011-05-19 NOTE — H&P (View-Only) (Signed)
  Subjective:     Rachel Mcdaniel is a 25 y.o. female and is here for a comprehensive physical exam prior to surgery.  She is scheduled for hysterectomy on 05/19/11 for endometriosis.  Reports increased menstrual bleeding.  History   Social History  . Marital Status: Single    Spouse Name: N/A    Number of Children: N/A  . Years of Education: N/A   Occupational History  . Not on file.   Social History Main Topics  . Smoking status: Current Everyday Smoker    Types: Cigarettes  . Smokeless tobacco: Never Used   Comment: down to 1 cig per day  . Alcohol Use: No  . Drug Use: No  . Sexually Active: Yes    Birth Control/ Protection: Condom   Other Topics Concern  . Not on file   Social History Narrative  . No narrative on file   Health Maintenance  Topic Date Due  . Pap Smear  06/24/2003  . Tetanus/tdap  06/23/2004  . Influenza Vaccine  01/20/2011    The following portions of the patient's history were reviewed and updated as appropriate: allergies, current medications, past family history, past medical history, past social history, past surgical history and problem list.  Review of Systems Reports anxiety, wants anxiolytic   Objective:  Blood pressure 122/87, pulse 87, temperature 98.6 F (37 C), temperature source Oral, height 5' 3" (1.6 m), weight 150 lb 12.8 oz (68.402 kg), last menstrual period 04/01/2011. GENERAL: Well-developed, well-nourished female in no acute distress.  HEENT: Normocephalic, atraumatic. Sclerae anicteric.  NECK: Supple. Normal thyroid.  LUNGS: Clear to auscultation bilaterally.  HEART: Regular rate and rhythm. BREASTS: Symmetric with everted nipples. No abnormal masses, skin changes, nipple drainage, or lymphadenopathy. ABDOMEN: Soft, nontender, nondistended. No organomegaly. PELVIC: Normal external female genitalia. Vagina is pink and rugated.  Normal discharge. Normal cervix contour. Pap smear obtained. Uterus is normal in size. EXTREMITIES: No  cyanosis, clubbing, or edema, 2+ distal pulses.   Assessment:     Annual exam Endometriosis   Plan:    Follow up pap smear Ativan, Percocet and Provera Rx given to patient Will proceed with surgery as planned, preoperative instructions reviewed. 

## 2011-05-19 NOTE — Anesthesia Postprocedure Evaluation (Signed)
  Anesthesia Post-op Note  Patient: Rachel Mcdaniel  Anesthesia Post Note  Patient: Rachel Mcdaniel  Procedure(s) Performed:  HYSTERECTOMY TOTAL LAPAROSCOPIC; SALPINGO OOPHERECTOMY; CYSTOSCOPY  Anesthesia type: GA  Patient location: PACU  Post pain: Pain level controlled  Post assessment: Post-op Vital signs reviewed  Last Vitals:  Filed Vitals:   05/19/11 1428  BP: 132/89  Pulse: 115  Temp: 36.7 C  Resp: 18    Post vital signs: Reviewed  Level of consciousness: sedated  Complications: No apparent anesthesia complications

## 2011-05-19 NOTE — Transfer of Care (Signed)
Immediate Anesthesia Transfer of Care Note  Patient: Rachel Mcdaniel  Procedure(s) Performed:  HYSTERECTOMY TOTAL LAPAROSCOPIC; SALPINGO OOPHERECTOMY; CYSTOSCOPY  Patient Location: PACU  Anesthesia Type: General  Level of Consciousness: awake, alert  and oriented  Airway & Oxygen Therapy: Patient Spontanous Breathing and Patient connected to nasal cannula oxygen  Post-op Assessment: Report given to PACU RN and Post -op Vital signs reviewed and stable  Post vital signs: Reviewed and stable  Complications: No apparent anesthesia complications

## 2011-05-19 NOTE — Interval H&P Note (Signed)
History and Physical Interval Note:  05/19/2011 10:05 AM  [employer]  has presented today for surgery, with the diagnosis of endometriosis  The various methods of treatment have been discussed with the patient and family. After consideration of risks, benefits and other options for treatment, the patient has consented to procedure(s): HYSTERECTOMY TOTAL LAPAROSCOPIC, BILATERAL SALPINGO-OOPHORECTOMY, POSSIBLE LAPAROSCOPIC ASSISTED VAGINAL HYSTERECTOMY, POSSIBLE LAPAROTOMY as a surgical intervention.  The patients' history has been reviewed, she had a normal pap smear on 05/01/11, patient examined, no change in status, stable for surgery.  I have reviewed the patients' chart and labs.  Questions were answered to the patient's satisfaction.     ANYANWU,UGONNA A M.D.

## 2011-05-19 NOTE — Anesthesia Preprocedure Evaluation (Signed)
Anesthesia Evaluation  Patient identified by MRN, date of birth, ID band Patient awake    Reviewed: Allergy & Precautions, H&P , Patient's Chart, lab work & pertinent test results, reviewed documented beta blocker date and time   History of Anesthesia Complications Negative for: history of anesthetic complications  Airway Mallampati: II TM Distance: >3 FB Neck ROM: full    Dental No notable dental hx.    Pulmonary neg pulmonary ROS,  clear to auscultation  Pulmonary exam normal       Cardiovascular Exercise Tolerance: Good hypertension, neg cardio ROS regular Normal    Neuro/Psych  Headaches, PSYCHIATRIC DISORDERS Negative Neurological ROS  Negative Psych ROS   GI/Hepatic negative GI ROS, Neg liver ROS,   Endo/Other  Negative Endocrine ROS  Renal/GU negative Renal ROS     Musculoskeletal   Abdominal   Peds  Hematology negative hematology ROS (+)   Anesthesia Other Findings Chronic pain in female pelvis     Tobacco abuse        Endometriosis     HTN (hypertension)   Hx PIH w/ preg; now borderline - no meds    Anemia   history Anxiety        Headache   OTC meds prn Depression    Reproductive/Obstetrics negative OB ROS                           Anesthesia Physical Anesthesia Plan  ASA: II  Anesthesia Plan: General ETT   Post-op Pain Management:    Induction:   Airway Management Planned:   Additional Equipment:   Intra-op Plan:   Post-operative Plan:   Informed Consent: I have reviewed the patients History and Physical, chart, labs and discussed the procedure including the risks, benefits and alternatives for the proposed anesthesia with the patient or authorized representative who has indicated his/her understanding and acceptance.   Dental Advisory Given  Plan Discussed with: CRNA and Surgeon  Anesthesia Plan Comments:         Anesthesia Quick Evaluation

## 2011-05-19 NOTE — Anesthesia Procedure Notes (Signed)
Procedure Name: Intubation Date/Time: 05/19/2011 11:49 AM Performed by: Karleen Dolphin Pre-anesthesia Checklist: Patient identified, Patient being monitored, Emergency Drugs available, Timeout performed and Suction available Patient Re-evaluated:Patient Re-evaluated prior to inductionOxygen Delivery Method: Circle System Utilized, Ambu Bag, Simple face mask and Nasal Cannula Preoxygenation: Pre-oxygenation with 100% oxygen Intubation Type: IV induction Ventilation: Oral airway inserted - appropriate to patient size and Mask ventilation without difficulty Laryngoscope Size: Mac and 3 Grade View: Grade I Tube type: Oral Tube size: 7.0 mm Number of attempts: 1 Airway Equipment and Method: stylet Placement Confirmation: ETT inserted through vocal cords under direct vision,  breath sounds checked- equal and bilateral and positive ETCO2 Secured at: 21 cm Tube secured with: Tape Dental Injury: Teeth and Oropharynx as per pre-operative assessment

## 2011-05-19 NOTE — Op Note (Signed)
[name] PROCEDURE DATE: 05/19/2011  PREOPERATIVE DIAGNOSIS: Endometriosis, refractory to medical management POSTOPERATIVE DIAGNOSIS: The same PROCEDURE: Total laparoscopic hysterectomy, bilateral salpingo-oophorectomy, cystoscopy SURGEON:  Dr. Jaynie Collins ASSISTANT: Dr. Nicholaus Bloom  INDICATIONS: 26 y.o. G1P1 with history of endometriosis and severe chronic pain not managed by medical therapy here for definitive surgery in the form of hysterectomy and bilateral salpingo-oophorectomy. Risks of surgery were discussed with the patient including but not limited to: bleeding which may require transfusion or reoperation; infection which may require antibiotics; injury to bowel, bladder, ureters or other surrounding organs; need for additional procedures; thromboembolic phenomenon, incisional problems and other postoperative/anesthesia complications. Written informed consent was obtained.    FINDINGS:  Diffuse lesions of endometriosis in the peritoneum and pelvis.  Small uterus.  Normal sized adnexa with some endometriotic implants, some adhesions on the left adnexa.  Bilateral ureteral jets on cystoscopy  ANESTHESIA:    General INTRAVENOUS FLUIDS:2500  ml ESTIMATED BLOOD LOSS:150 ml URINE OUTPUT: 250 ml SPECIMENS: Uterus, cervix, bilateral fallopian tubes and ovaries COMPLICATIONS: None immediate  PROCEDURE IN DETAIL:  The patient received intravenous antibiotics and had sequential compression devices applied to her lower extremities while in the preoperative area.  She was then taken to the operating room where general anesthesia was administered and was found to be adequate.  She was placed in the dorsal lithotomy position, and was prepped and draped in a sterile manner.  A RUMI uterine manipulator was placed at this time.  A Foley catheter was inserted into her bladder and attached to constant drainage. After an adequate timeout was performed, attention was turned to the abdomen where an  umbilical incision was made and the Veress needle inserted into the abdominal cavity without difficulty.  Correct position was confirmed with low initial intra-abdominal pressure.  The abdomen was then insufflated until the anterior abdominal wall was sufficiently distended.  The Veress needle was then removed, and the 10-mm umbilical trocar was inserted without difficulty.  A 0-degree laparoscope was inserted and the contents of the abdominal cavity were visualized with the findings as above.  Bilateral lower quadrant ports (5 mm on the right and 10 mm on the left) were then placed under direct visualization approximately 5 cm below the level of the umbilicus and lateral to the epigastric vessels.   The pelvis was then carefully examined, some adhesions were noted near the left adnexa so the decision was made to proceed with rest of the procedure, and then remove the left adnexa.  Attention was turned to the uteroovarian ligament on the patient's left side, which was clamped using the Harmonic scalpel and ligated.  Good hemostasis was noted. The broad ligament was also ligated with the Harmonic scalpel working towards the round ligament.  The round ligament were then clamped and transected with the Harmonic scalpel.  The ureters were noted to be safely away from the area of dissection.  The uterine artery was then skeletonized and a bladder flap was created.  The bladder was then bluntly dissected off the lower uterine segment.  At this point, attention was turned to the uterine vessels, which were clamped and ligated using the Harmonic scalpel.  There was small amount of bleeding noted, which was controlled using the electrocautery.  Good hemostasis was noted overall.  The broad ligament was then clamped, cut and ligated and the uterosacral and cardinal ligaments were clamped, cut and ligated.  Attention was then turned to the patient's right side, which was treated in a similar  manner by first taking the  infundibulopelvic ligament, the round ligament and the broad ligaments and the bladder flap creation was completed. The uterine artery was also coagulated and transected in a similar fashion.  The uterosacral and cardinal ligaments were also transected on the right side.  Attention was then turned to the cervicovaginal junction, and the Harmonic scalpel was used to transect the cervix from the surrounding vagina using the ring of the RUMI as a guide. This was done circumferentially allowing total hysterectomy.  The EnSeal Device was then used to clamp, coagulate and cut the left infundibulopelvic ligament, allowing for left salpingo-oophorectomy. The uterus plus cervix and bilateral adnexae were then removed from the vagina and the vaginal cuff incision was then closed with four 0 Vicryl figure-of-eight sutures.  Overall excellent hemostasis was noted.  The ureters were reexamined bilaterally and were pulsating normally. The abdominal pressure was reduced and hemostasis was confirmed. At this point, cystoscopy was performed after intravenous indigo carmine administration which showed bilateral ureteral jets.   The trocars were then removed under direct visualization with no obvious bleeding noted.  All incisions were also reapproximated with 4-0 Vicryl and Dermabond.  Following the procedure, all instruments were removed including the Foley catheter.  Sponge, lap, needle and instrument  counts were correct x 2.  The patient tolerated the procedure without complication and was transferred to the recovery room in stable condition.

## 2011-05-20 LAB — CBC
Hemoglobin: 9.8 g/dL — ABNORMAL LOW (ref 12.0–15.0)
MCHC: 32.2 g/dL (ref 30.0–36.0)
Platelets: 257 10*3/uL (ref 150–400)
RBC: 3.26 MIL/uL — ABNORMAL LOW (ref 3.87–5.11)

## 2011-05-20 MED ORDER — OXYCODONE-ACETAMINOPHEN 5-325 MG PO TABS: 1.0000 | ORAL_TABLET | ORAL | Status: DC | PRN

## 2011-05-20 MED ORDER — KETOROLAC TROMETHAMINE 30 MG/ML IJ SOLN
30.0000 mg | Freq: Once | INTRAMUSCULAR | Status: AC
  Administered 2011-05-20: 30 mg via INTRAMUSCULAR
  Filled 2011-05-20: qty 1

## 2011-05-20 MED ORDER — HYDROMORPHONE HCL 2 MG PO TABS
2.0000 mg | ORAL_TABLET | ORAL | Status: DC | PRN
  Administered 2011-05-20: 4 mg via ORAL
  Filled 2011-05-20: qty 2

## 2011-05-20 MED ORDER — ESTRADIOL 1 MG PO TABS: 1.0000 mg | ORAL_TABLET | Freq: Every day | ORAL | Status: DC

## 2011-05-20 MED ORDER — IBUPROFEN 600 MG PO TABS: 600.0000 mg | ORAL_TABLET | Freq: Four times a day (QID) | ORAL | Status: AC | PRN

## 2011-05-20 MED ORDER — ALPRAZOLAM 0.5 MG PO TABS: 0.5000 mg | ORAL_TABLET | Freq: Three times a day (TID) | ORAL | Status: DC | PRN

## 2011-05-20 MED ORDER — KETOROLAC TROMETHAMINE 30 MG/ML IJ SOLN
30.0000 mg | Freq: Once | INTRAMUSCULAR | Status: AC
  Administered 2011-05-20: 30 mg via INTRAVENOUS
  Filled 2011-05-20: qty 1

## 2011-05-20 MED ORDER — ESTRADIOL 1 MG PO TABS
1.0000 mg | ORAL_TABLET | Freq: Every day | ORAL | Status: DC
  Administered 2011-05-20: 1 mg via ORAL
  Filled 2011-05-20: qty 1

## 2011-05-20 MED ORDER — HYDROMORPHONE HCL 2 MG PO TABS: 2.0000 mg | ORAL_TABLET | ORAL | Status: DC | PRN

## 2011-05-20 NOTE — Progress Notes (Signed)
UR Chart review completed.  

## 2011-05-20 NOTE — Discharge Summary (Signed)
Gynecology Physician Discharge Summary  Patient ID: Rachel Mcdaniel MRN: [id number] DOB/AGE: [birth year] 25 y.o.  Admit date: 05/19/2011 Discharge date: 05/20/2011  Admission Diagnoses: Endometriosis  Procedures: Procedure(s) (LRB): HYSTERECTOMY TOTAL LAPAROSCOPIC (N/A) SALPINGO OOPHERECTOMY (Bilateral) CYSTOSCOPY (N/A)  Significant Diagnostic Studies: Preoperative hemoglobin 11.8 Lab Results  Component Value Date   HGB 9.8* 05/20/2011   Hospital Course:  Rachel Mcdaniel is a 25 y.o. G1P1  admitted for scheduled definitive surgery for endometriosis.  She underwent the procedures as mentioned above, her operation was uncomplicated. For further details about surgery, please refer to the operative report. Patient had an uncomplicated postoperative course. By time of discharge, her pain was controlled on oral pain medications; she was ambulating, voiding without difficulty, and tolerating regular diet.  She was started on estradiol for hormone replacement therapy prior to discharge. She was deemed stable for discharge to home.   Discharge Exam: Blood pressure 135/86, pulse 86, temperature 98.3 F (36.8 C), temperature source Oral, resp. rate 18, height 5\' 3"  (1.6 m), weight 68.04 kg (150 lb), SpO2 99.00%. General appearance: alert and no distress Resp: clear to auscultation bilaterally Cardio: regular rate and rhythm GI: soft, non-tender; bowel sounds normal; no masses,  no organomegaly Pelvic: minimal bleeding Extremities: extremities normal, atraumatic, no cyanosis or edema Incision/Wound: No erythema, no drainage, healing well  Discharged Condition: Stable  Disposition: Home or Self Care   Medication List  As of 05/20/2011  7:49 AM   STOP taking these medications         oxyCODONE-acetaminophen 5-325 MG per tablet         TAKE these medications         ALPRAZolam 0.5 MG tablet   Commonly known as: XANAX   Take 1 tablet (0.5 mg total) by mouth 3 (three) times daily as  needed for anxiety or sleep.      estradiol 1 MG tablet   Commonly known as: ESTRACE   Take 1 tablet (1 mg total) by mouth daily.      HYDROmorphone 2 MG tablet   Commonly known as: DILAUDID   Take 1-2 tablets (2-4 mg total) by mouth every 4 (four) hours as needed for pain.      ibuprofen 600 MG tablet   Commonly known as: ADVIL,MOTRIN   Take 1 tablet (600 mg total) by mouth every 6 (six) hours as needed for pain (mild pain).             SignedJaynie Collins A 05/20/2011, 7:49 AM

## 2011-05-20 NOTE — Anesthesia Postprocedure Evaluation (Signed)
  Anesthesia Post-op Note  Patient: Rachel Mcdaniel  Procedure(s) Performed:  HYSTERECTOMY TOTAL LAPAROSCOPIC; SALPINGO OOPHERECTOMY; CYSTOSCOPY  Patient Location: 303  Anesthesia Type: General  Level of Consciousness: awake, alert , oriented and patient cooperative  Airway and Oxygen Therapy: Patient Spontanous Breathing  Post-op Pain: moderate  Post-op Assessment: Post-op Vital signs reviewed, Patient's Cardiovascular Status Stable, Respiratory Function Stable, Patent Airway, No signs of Nausea or vomiting and Adequate PO intake  Post-op Vital Signs: Reviewed and stable  Complications: No apparent anesthesia complications

## 2011-05-20 NOTE — Progress Notes (Signed)
1 Day Post-Op Procedure(s): HYSTERECTOMY TOTAL LAPAROSCOPIC BILATERAL SALPINGO OOPHERECTOMY CYSTOSCOPY  Subjective: Patient reports incisional pain and tolerating PO.  Voiding but reports some urethral discomfort after foley removal.  Objective: I have reviewed patient's vital signs, intake and output, medications and labs.  General: alert and no distress Resp: clear to auscultation bilaterally Cardio: regular rate and rhythm GI: soft, non-tender; bowel sounds normal; no masses,  no organomegaly Extremities: extremities normal, atraumatic, no cyanosis or edema Vaginal Bleeding: minimal  Assessment: s/p Procedure(s): HYSTERECTOMY TOTAL LAPAROSCOPIC BILATERAL SALPINGO OOPHERECTOMY CYSTOSCOPY: stable, progressing well and tolerating diet  Plan: Advance diet Encourage ambulation Discharge home later today  LOS: 1 day    ANYANWU,UGONNA A 05/20/2011, 7:37 AM

## 2011-05-20 NOTE — Addendum Note (Signed)
Addendum  created 05/20/11 0815 by Erskine Speed, CRNA   Modules edited:Notes Section

## 2011-05-21 ENCOUNTER — Encounter (HOSPITAL_COMMUNITY): Payer: Self-pay | Admitting: Obstetrics & Gynecology

## 2011-05-23 ENCOUNTER — Inpatient Hospital Stay (HOSPITAL_COMMUNITY)
Admission: AD | Admit: 2011-05-23 | Discharge: 2011-05-23 | Disposition: A | Discharge status: Home / Self Care | Payer: Self-pay | Source: Ambulatory Visit | Attending: Obstetrics & Gynecology | Admitting: Obstetrics & Gynecology

## 2011-05-23 ENCOUNTER — Encounter (HOSPITAL_COMMUNITY): Payer: Self-pay | Admitting: *Deleted

## 2011-05-23 DIAGNOSIS — L0390 Cellulitis, unspecified: Secondary | ICD-10-CM

## 2011-05-23 DIAGNOSIS — G8918 Other acute postprocedural pain: Secondary | ICD-10-CM

## 2011-05-23 MED ORDER — BISACODYL 10 MG RE SUPP: 10.0000 mg | RECTAL | Status: AC | PRN

## 2011-05-23 MED ORDER — SULFAMETHOXAZOLE-TRIMETHOPRIM 800-160 MG PO TABS: 1.0000 | ORAL_TABLET | Freq: Two times a day (BID) | ORAL | Status: AC

## 2011-05-23 MED ORDER — OXYCODONE HCL 10 MG PO TABS: 10.0000 mg | ORAL_TABLET | ORAL | Status: DC | PRN

## 2011-05-23 NOTE — ED Notes (Signed)
Dr. Macon Large informed of BP readings in MAU, OK for pt to be DC'd home.

## 2011-05-23 NOTE — ED Provider Notes (Signed)
History:  26 y.o. P2 seen in MAU today for evaluation of incisional pain after TLH/RSO on 05/19/11 done for severe endometriosis and chronic pelvic pain.  She is worried she may have an infection and reports increased pain at the left lower quadrant site.  Patient also reports not having bowel movement since prior to surgery; but she is is taking a lot of narcotics (reports taking 3 Percocets every four hours).  Denies any fevers, nausea or vomiting or other systemic symptoms.  The following portions of the patient's history were reviewed and updated as appropriate: allergies, current medications, past family history, past medical history, past social history, past surgical history and problem list.   Objective:  Physical Exam Blood pressure 138/103, pulse 107, temperature 98.7 F (37.1 C), temperature source Oral, resp. rate 20, last menstrual period 04/15/2011, SpO2 97.00%. Gen: NAD Abd: Soft, nontender and non-distended.  Left sided laparoscopic incision with some purulent discharge, mild erythema, no induration. Moderate tenderness to palpation Pelvic: Deferred  Labs and Imaging Surgical Pathology showed endometriosis  Assessment & Plan:   Advised against taking Percocet more than prescribed given acetaminophen toxicity. Will discontinue Percocet, Oxycodone 10 mg Rx given to taken every four hours as needed for pain, in addition to Ibuprofen and no more than 4 g of acetaminophen/day as needed.  Bactrim DS bid x 7 days also prescribed; Dulcolax also prescribed for constipation.  Patient told to return to MAU for any worsening symptoms.  Jaynie Collins, M.D. 05/23/2011 4:53 PM

## 2011-05-23 NOTE — Progress Notes (Addendum)
Had full- laproscopic hysterectomy on Mon, Dr Lynetta Mare.  Has been having problems getting pain under control.   Noted drainage from one of my incisions, is green in color and has odor.    No fever, no n/v/d.  Has been constipated.

## 2011-05-26 ENCOUNTER — Telehealth: Payer: Self-pay | Admitting: *Deleted

## 2011-05-26 NOTE — Telephone Encounter (Signed)
Pt left message stating that her grandmother accidentally threw away the wrong medication- new Rx which was received last week. She states that her husband has been to the dumpster but could not find the medication. She wants to know what to do. She has a few pills left of the "old" pain med left and wants to know if she can take them. Also wants to know what to do about the discarded Rx.

## 2011-05-26 NOTE — Telephone Encounter (Signed)
I consulted with Dr. Macon Large. She stated that she will write only one additional Rx for [name] tomorrow. Meanwhile, she can take the remainder of her percocet, provided she does not exceed the prescribed dose. Also, she may use the ibuprofen as previously instructed.  I called [name] and left message of instructions from Dr. Macon Large. I stated that I will call her tomorrow when the new Rx is ready for pick up.

## 2011-05-27 ENCOUNTER — Other Ambulatory Visit: Payer: Self-pay | Admitting: Obstetrics & Gynecology

## 2011-05-27 DIAGNOSIS — Z90710 Acquired absence of both cervix and uterus: Secondary | ICD-10-CM

## 2011-05-27 DIAGNOSIS — F1120 Opioid dependence, uncomplicated: Secondary | ICD-10-CM

## 2011-05-27 MED ORDER — OXYCODONE HCL 10 MG PO TABS: 10.0000 mg | ORAL_TABLET | ORAL | Status: DC | PRN

## 2011-05-27 NOTE — Progress Notes (Signed)
Patient reports that her grandmother accidentally threw out her Rx for Oxycodone that was written during her MAU visit 05/23/11.  She was told to be more careful with her medications, she should not need narcotic medication after 2 weeks for an uncomplicated laparoscopic hysterectomy.  As such, she will be given only one more narcotic prescription, she will come to pick it up from clinic today.  If she needs more narcotics after this, she will be referred to a program to manage her narcotic dependence as her endometriosis will not longer be used as a reason for her continued pain after she has received definitive surgery.  Jaynie Collins, M.D. 05/27/2011 10:45 AM

## 2011-05-28 NOTE — Telephone Encounter (Signed)
Dr. Macon Large called pt yesterday and notified her that the Rx was ready for pick up and that she would not be able to provide any additional narcotic medication after this Rx.

## 2011-06-02 ENCOUNTER — Other Ambulatory Visit: Payer: Self-pay | Admitting: Obstetrics & Gynecology

## 2011-06-02 ENCOUNTER — Telehealth: Payer: Self-pay | Admitting: *Deleted

## 2011-06-02 DIAGNOSIS — F419 Anxiety disorder, unspecified: Secondary | ICD-10-CM

## 2011-06-02 NOTE — Telephone Encounter (Signed)
Xanax prescription approved.  You may call it in for her, if possible.

## 2011-06-02 NOTE — Telephone Encounter (Signed)
Pt left message stating that she has been having trouble sleeping and anxiety attacks. She stated that she wanted to speak to Dr. Macon Large. She would like a refill of the Alprazolam that she received after surgery. She states that she is only sleeping 30 min. at a time.  Request routed to Dr. Macon Large.

## 2011-06-05 ENCOUNTER — Telehealth: Payer: Self-pay | Admitting: *Deleted

## 2011-06-05 NOTE — Telephone Encounter (Signed)
Received message from this number was unable to hear the message due to static on the line.

## 2011-06-05 NOTE — Telephone Encounter (Signed)
Left patient a message letting her know that we are returning her call .

## 2011-06-09 ENCOUNTER — Encounter (HOSPITAL_BASED_OUTPATIENT_CLINIC_OR_DEPARTMENT_OTHER): Payer: Self-pay | Admitting: *Deleted

## 2011-06-09 ENCOUNTER — Telehealth: Payer: Self-pay | Admitting: *Deleted

## 2011-06-09 ENCOUNTER — Emergency Department (HOSPITAL_BASED_OUTPATIENT_CLINIC_OR_DEPARTMENT_OTHER)
Admission: EM | Admit: 2011-06-09 | Discharge: 2011-06-09 | Disposition: A | Discharge status: Home / Self Care | Payer: Self-pay | Attending: Emergency Medicine | Admitting: Emergency Medicine

## 2011-06-09 DIAGNOSIS — IMO0002 Reserved for concepts with insufficient information to code with codable children: Secondary | ICD-10-CM | POA: Insufficient documentation

## 2011-06-09 DIAGNOSIS — I10 Essential (primary) hypertension: Secondary | ICD-10-CM | POA: Insufficient documentation

## 2011-06-09 DIAGNOSIS — Y838 Other surgical procedures as the cause of abnormal reaction of the patient, or of later complication, without mention of misadventure at the time of the procedure: Secondary | ICD-10-CM | POA: Insufficient documentation

## 2011-06-09 MED ORDER — CEPHALEXIN 500 MG PO CAPS: 500.0000 mg | ORAL_CAPSULE | Freq: Four times a day (QID) | ORAL | Status: AC

## 2011-06-09 NOTE — Discharge Instructions (Signed)
Postsurgical Bleeding You have developed bleeding after surgery. A little bleeding following surgery may be normal. Sometimes a second surgery to stop the bleeding is needed.  SYMPTOMS  The problems of bleeding following surgery depend on the amount and location of the bleeding:  Sometimes there is a little bleeding from a wound following surgery. This is extremely common. It is not usually problem.   Some surgeries will always have a small amount of bleeding following the surgery. An example of this would be a D & C (dilatation and curettage). This is a procedure where the inside of the uterus is scraped out. Because the surface is raw inside the uterus after the procedure, there is almost always some bleeding or oozing.   Occasionally there may be a small vessel that either breaks loose from a suture (stitch) or a vessel that was not bleeding during the procedure because of spasm in the vessel. Then when the spasm goes away after surgery, bleeding begins.   Bleeding into your brain after brain surgery happens occasionally and is very dangerous.  DIAGNOSIS  Your caregiver will often know what is wrong by examining you.  TREATMENT   The treatment of bleeding problems following surgery will depend on the amount and the location.   Your caregiver will decide if it is safe to watch this. If the problem does not get better, some additional surgery may be needed.   Worrisome bleeding may require faster action and more surgery. It may be necessary to take you immediately back to surgery if there is a sudden loss of blood pressure following surgery. There may not be time to consult with family, or even the patient, if the problems are sudden and severe.   A blood transfusion may be needed.  HOME CARE INSTRUCTIONS If you have questions about your care, discuss them with your caregiver. Make sure all of your questions are answered. SEEK MEDICAL CARE IF: Bleeding is increased, or there is increased  pain, swelling, heat or redness in the wound or you develop an unexplained temperature. Document Released: 06/27/2004 Document Revised: 12/18/2010 Document Reviewed: 03/04/2007 [facility] Patient Information 2012 [place], [place].

## 2011-06-09 NOTE — Telephone Encounter (Signed)
Pt left message stating that she would like refill of alprazolam. She has been having anxiety and cannot sleep. Review of chart reveals refill authorized by Dr. Macon Large last week.  Pt informed that refill has been ordered and will be called to her pharmacy. [facility] requests [facility] [address]  I checked with the [facility] on [address] where pt had previously received the medication and verified that they have not filled any Alprazolam since 05/20/11.   Rx called in as requested.

## 2011-06-09 NOTE — ED Provider Notes (Signed)
Medical screening examination/treatment/procedure(s) were performed by non-physician practitioner and as supervising physician I was immediately available for consultation/collaboration.   Deanne Bedgood A. Juliani Laduke, MD 06/09/11 2352 

## 2011-06-09 NOTE — ED Notes (Signed)
Hysterectomy 3 weeks ago. Woke from a nap with blood from her umbilical incision.

## 2011-06-09 NOTE — Telephone Encounter (Signed)
Called pt and left message stating that this is our second attempt on returning her call and if she has any questions or concerns to please call our nurse callback line.

## 2011-06-09 NOTE — ED Provider Notes (Signed)
History     CSN: [id number]  Arrival date & time 06/09/11  1716   First MD Initiated Contact with Patient 06/09/11 1826      Chief Complaint  Patient presents with  . Drainage from Incision    (Consider location/radiation/quality/duration/timing/severity/associated sxs/prior treatment) HPI Comments: Pt states that she had a laparoscopic partial hysterectomy 3 weeks ago:pt states that she woke up this morning and she had blood out of her incision on her shirt:pt states that she has not had any bleeding since:pt states that she has not had fever or pain to the area:pt states that she came in today because he doctor office was closed:pt is also asking for more pain medication  The history is provided by the patient. No language interpreter was used.    Past Medical History  Diagnosis Date  . 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 Tobacco abuse   . Endometriosis   . HTN (hypertension)     Hx PIH w/ preg; now borderline - no meds  . Anemia     history  . Anxiety   . Headache     OTC meds prn  . Depression     History - no meds    Past Surgical History  Procedure Date  . Exploratory laparotomy 11/11/07    LOA, Right ovarian cystectomy  . Diagnostic laparoscopy 05/16/10    Convert Exp Laparotomy, LOA, Fulguration of endometriosis, Right oophorectomy  . Svd 2010    x 1  . Laparoscopic hysterectomy 05/19/2011    Procedure: HYSTERECTOMY TOTAL LAPAROSCOPIC;  Surgeon: Tereso Newcomer, MD;  Location: [facility];  Service: Gynecology;  Laterality: N/A;  . Salpingoophorectomy 05/19/2011    Procedure: SALPINGO OOPHERECTOMY;  Surgeon: Tereso Newcomer, MD;  Location: [facility];  Service: Gynecology;  Laterality: Bilateral;  . Cystoscopy 05/19/2011    Procedure: CYSTOSCOPY;  Surgeon: Tereso Newcomer, MD;  Location: [facility];  Service: Gynecology;  Laterality: N/A;  . Abdominal hysterectomy     Family History  Problem Relation Age of Onset  . Hypertension Father   . Depression Mother   . Lupus  Mother   . Cancer Mother     History  Substance Use Topics  . Smoking status: Current Everyday Smoker -- 0.2 packs/day for 2 years    Types: Cigarettes  . Smokeless tobacco: Never Used   Comment: down to 1 cig per day  . Alcohol Use: No    OB History    Grav Para Term Preterm Abortions TAB SAB Ect Mult Living   1 1 1       1       Review of Systems  All other systems reviewed and are negative.    Allergies  Review of patient's allergies indicates no known allergies.  Home Medications   Current Outpatient Rx  Name Route Sig Dispense Refill  . ALPRAZOLAM 0.5 MG PO TABS  TAKE 1 TABLET BY MOUTH THREE TIMES DAILY AS NEEDED FOR ANXIETY OR SLEEP 30 tablet 1    No refills available for controlled drug  . ESTRADIOL 1 MG PO TABS Oral Take 1 tablet (1 mg total) by mouth daily. 30 tablet 12  . OXYCODONE HCL 10 MG PO TABS Oral Take 1 tablet (10 mg total) by mouth every 4 (four) hours as needed (moderate or severe pain). 40 each 0    BP 137/95  Pulse 112  Temp(Src) 98.1 F (36.7 C) (Oral)  Resp 20  SpO2 100%  LMP 04/15/2011  Physical Exam  Nursing note and vitals reviewed. Constitutional: She is oriented to person, place, and time. She appears well-developed and well-nourished.  HENT:  Head: Normocephalic and atraumatic.  Eyes: EOM are normal.  Neck: Neck supple.  Cardiovascular: Normal rate and regular rhythm.   Pulmonary/Chest: Effort normal and breath sounds normal.  Abdominal:       Pt has no redness or swelling noted to umbilicus:pt has some brownish drainage noted around the area:pt has no tenderness to the area  Musculoskeletal: Normal range of motion.  Neurological: She is alert and oriented to person, place, and time.  Skin: Skin is warm and dry.  Psychiatric: She has a normal mood and affect.    ED Course  Procedures (including critical care time)  Labs Reviewed - No data to display No results found.   1. Post-op bleeding       MDM  Don't think  any imaging is needed at this time:will treat for possible infection although unlikely:discussed with pt that she needs to talk to her gyn about pain control and it appears in previous notes that they have discussed this with pt        Teressa Lower, NP 06/09/11 1853

## 2011-06-09 NOTE — ED Notes (Signed)
No active bleeding at time of triage

## 2011-06-12 ENCOUNTER — Other Ambulatory Visit: Payer: Self-pay | Admitting: Obstetrics & Gynecology

## 2011-06-12 ENCOUNTER — Inpatient Hospital Stay (HOSPITAL_COMMUNITY)
Admission: AD | Admit: 2011-06-12 | Discharge: 2011-06-12 | Disposition: A | Discharge status: Home Health | Payer: Self-pay | Source: Ambulatory Visit | Attending: Obstetrics & Gynecology | Admitting: Obstetrics & Gynecology

## 2011-06-12 ENCOUNTER — Encounter (HOSPITAL_COMMUNITY): Payer: Self-pay | Admitting: *Deleted

## 2011-06-12 ENCOUNTER — Telehealth: Payer: Self-pay | Admitting: *Deleted

## 2011-06-12 DIAGNOSIS — F1120 Opioid dependence, uncomplicated: Secondary | ICD-10-CM

## 2011-06-12 DIAGNOSIS — F321 Major depressive disorder, single episode, moderate: Secondary | ICD-10-CM

## 2011-06-12 DIAGNOSIS — F1920 Other psychoactive substance dependence, uncomplicated: Secondary | ICD-10-CM

## 2011-06-12 DIAGNOSIS — F19939 Other psychoactive substance use, unspecified with withdrawal, unspecified: Secondary | ICD-10-CM | POA: Insufficient documentation

## 2011-06-12 DIAGNOSIS — I10 Essential (primary) hypertension: Secondary | ICD-10-CM | POA: Insufficient documentation

## 2011-06-12 DIAGNOSIS — F4001 Agoraphobia with panic disorder: Secondary | ICD-10-CM

## 2011-06-12 DIAGNOSIS — Z90710 Acquired absence of both cervix and uterus: Secondary | ICD-10-CM

## 2011-06-12 LAB — URINE MICROSCOPIC-ADD ON

## 2011-06-12 LAB — URINALYSIS, ROUTINE W REFLEX MICROSCOPIC
Bilirubin Urine: NEGATIVE
Nitrite: NEGATIVE
Specific Gravity, Urine: 1.02 (ref 1.005–1.030)
pH: 6.5 (ref 5.0–8.0)

## 2011-06-12 LAB — CBC
HCT: 40.2 % (ref 36.0–46.0)
MCV: 89.1 fL (ref 78.0–100.0)
Platelets: 463 10*3/uL — ABNORMAL HIGH (ref 150–400)
RBC: 4.51 MIL/uL (ref 3.87–5.11)
WBC: 11.5 10*3/uL — ABNORMAL HIGH (ref 4.0–10.5)

## 2011-06-12 MED ORDER — AMLODIPINE BESYLATE 5 MG PO TABS: 5.0000 mg | ORAL_TABLET | Freq: Every day | ORAL | Status: DC

## 2011-06-12 MED ORDER — OXYCODONE HCL 10 MG PO TABS: 10.0000 mg | ORAL_TABLET | Freq: Four times a day (QID) | ORAL | Status: DC | PRN

## 2011-06-12 NOTE — Discharge Instructions (Signed)
Drug Abuse, Frequently Asked Questions  Drug addiction is a complex brain disease. It is characterized by compulsive, at times uncontrollable, drug craving, seeking, and use that persists even in the face of extremely negative results. Drug seeking becomes compulsive, in large part as a result of the effects of prolonged drug use on brain functioning and, thus, on behavior. For many people, drug addiction becomes chronic, with relapses possible even after long periods of being off the drug.  HOW QUICKLY CAN I BECOME ADDICTED TO A DRUG?  There is no easy answer to this. If and how quickly you might become addicted to a drug depends on many factors including the biology of your body. All drugs are potentially harmful and may have life-threatening consequences associated with their use. There are also vast differences among individuals in sensitivity to various drugs. While one person may use a drug many times and suffer no ill effects, another person may be particularly vulnerable and overdose or developing a craving with the first use. There is no way of knowing in advance how someone may react.  HOW DO I KNOW IF SOMEONE IS ADDICTED TO DRUGS?  If a person is compulsively seeking and using a drug despite negative consequences (such as loss of job, debt, physical problems brought on by drug abuse, or family problems) then he or she is probably addicted. Those who screen for drug problems, such as physicians, have developed the CAGE questionnaire. These four simple questions can help detect substance abuse problems:   Have you ever felt you ought to Cut down on your drinking/drug use?   Have people ever Annoyed you by criticizing your drinking/drug use?   Have you ever felt bad or Guilty about your drinking/drug use?   Have you ever had a drink or taken a drug first thing in the morning to steady your nerves or get rid of a hangover (Eye-opener)?  WHAT ARE THE PHYSICAL SIGNS OF ABUSE OR ADDICTION?  The physical  signs of abuse or addiction can vary depending on the person and the drug being abused. For example, someone who abuses marijuana may have a chronic cough or worsening of asthmatic conditions. THC, the chemical in marijuana responsible for producing its effects, is associated with weakening the immune system which makes the user more vulnerable to infections, such as pneumonia. Each drug has short-term and long-term physical effects. Stimulants like cocaine increase heart rate and blood pressure, whereas opioids like heroin may slow the heart rate and reduce breathing (respiration).   ARE THERE EFFECTIVE TREATMENTS FOR DRUG ADDICTION?  Drug addiction can be effectively treated with behavioral-based therapies and, for addiction to some drugs such as heroin or nicotine, medications may be used. Treatment may vary for each person depending on the type of drug(s) being used and multiple courses of treatment may be needed to achieve success. Research has revealed 13 basic principles that underlie effective drug addiction treatment. These are discussed in NIDA's Principles of Drug Addiction Treatment: A Research-Based Guide.  WHERE CAN I FIND INFORMATION ABOUT DRUG TREATMENT PROGRAMS?   For referrals to treatment programs, visit the Substance Abuse and Mental Health Services Administration online at http://findtreatment.samhsa.gov/.   NIDA publishes an expanding series of treatment manuals, the "clinical toolbox," that gives drug treatment providers research-based information for creating effective treatment programs.  WHAT IS DETOXIFICATION, OR "DETOX"?  Detoxification is the process of allowing the body to rid itself of a drug while managing the symptoms of withdrawal. It is often the first   with a behavioral-based therapy and/or a medication, if available. Detox alone with no follow-up is not treatment.  WHAT IS WITHDRAWAL? HOW LONG DOES IT  LAST? Withdrawal is the variety of symptoms that occur after use of some addictive drugs is reduced or stopped. Length of withdrawal and symptoms vary with the type of drug. For example, physical symptoms of heroin withdrawal may include restlessness, muscle and bone pain, insomnia, diarrhea, vomiting, and cold flashes. These physical symptoms may last for several days, but the general depression, or dysphoria (opposite of euphoria), that often accompanies heroin withdrawal, may last for weeks. In many cases withdrawal can be easily treated with medications to ease the symptoms. But treating withdrawal is not the same as treating addiction.  WHAT ARE THE COSTS OF DRUG ABUSE TO SOCIETY? Beyond the raw numbers are other costs to society:  Spread of infectious diseases such as HIV/AIDS and hepatitis C either through sharing of drug paraphernalia or unprotected sex.   Deaths due to overdose or other complications from drug use.   Effects on unborn children of pregnant drug users.   Other effects such as crime and homelessness.  IF A PREGNANT WOMAN ABUSES DRUGS, DOES IT AFFECT THE FETUS?  Many substances including alcohol, nicotine, and drugs of abuse can have negative effects on the developing fetus because they are transferred to the fetus across the placenta. For example, nicotine has been connected with premature birth and low birth weight, as has the use of cocaine. Scientific studies have shown that babies born to marijuana users were shorter, weighed less, and had smaller head sizes than those born to mothers who did not use the drug. Smaller babies are more likely to develop health problems.   Whether a baby's health problems, if caused by a drug, will continue as the child grows, is not always known. Research does show that children born to mothers who used marijuana regularly during pregnancy may have trouble concentrating, when older. Our research continues to produce insights on the negative  effects of drug use on the fetus.  Document Released: 04/10/2003 Document Revised: 12/18/2010 Document Reviewed: 07/07/2008 [facility] Patient Information 2012 [place], [place].   Narcotic Withdrawal When drug use interferes with normal living activities and relationships, it is abuse. Abuse includes problems with family and friends. Psychological dependence has developed when your mind tells you that the drug is needed. This is usually followed by a physical dependence in which you need more of the drug to get the same feeling or "high." This is known as addiction or chemical dependency. Risk is greater when chemical dependency exists in the family. SYMPTOMS  When tolerance to narcotics has developed, stopping of the narcotic suddenly can cause uncomfortable physical symptoms. Most of the time these are mild and consist of shakes or jitters (tremors) in the hands,a rapid heart rate, rapid breathing, and temperature. Sometimes these symptoms are associated with anxiety, panic attacks, and bad dreams. Other symptoms include:  Irritability.   Anxiety.   Runny nose.   "Goose flesh."   Diarrhea.   Feeling sick to the stomach (nauseous).   Muscle spasms.   Sleeplessness.   Chills.   Sweats.   Drug cravings.   Confusion.  The severity of the withdrawal is based on the individual and varies from person to person. Many people choose to continue using narcotics to get rid of the discomfort of withdrawal. They also use to try to feel normal. TREATMENT  Quitting an addiction means stopping use of all chemicals.  This is hard but may save your life. With continual drug use, possible outcomes are often loss of self respect and esteem, violence, death, and eventually prison if the use of narcotics has led to the death of another. Addiction cannot be cured, but it can be stopped. This often requires outside help and the care of professionals. Most hospitals and clinics can refer you to a specialized  care center. It is not necessary for you to go through the uncomfortable symptoms of withdrawal. Your caregiver can provide you with medications that will help you through this difficult period. Try to avoid situations, friends, or alcohol, which may have made it possible for you to continue using narcotics in the past. Learn how to say no! HOME CARE INSTRUCTIONS   Drink fluids, get plenty of rest, and take hot baths.   Medicines may be prescribed to help control withdrawal symptoms.   Over-the-counter medicines may be helpful to control diarrhea or an upset stomach.   If your problems resulted from taking prescription pain medicines, make sure you have a follow-up visit with your caregiver within the next few days. Be open about this problem.   If you are dependent or addicted to street drugs, contact a local drug and alcohol treatment center or Narcotics Anonymous.   Have someone with you to monitor your symptoms.   Engage in healthy activities with friends who do not use drugs.   Stay away from the drug scene.  It takes a long period of time to overcome addictions to all drugs. There may be times when you feel as though you want to use. Following loss of a physical addiction and going through withdrawal, you have conquered the most difficult part of getting rid of an addiction. Gradually, you will have a lessening of the craving that is telling you that you need narcotics to feel normal. Call your caregiver or a member of your support group if more support is needed. Learn who to talk to in your family and among your friends so that during these periods you can receive outside help. SEEK IMMEDIATE MEDICAL CARE IF:   You have vomiting that cannot be controlled, especially if you cannot keep liquids down.   You are seeing things or hearing voices that are not really there (hallucinating).   You have a seizure.  Document Released: 06/28/2002 Document Revised: 12/18/2010 Document Reviewed:  04/02/2008 [facility] Patient Information 2012 [place], [place].

## 2011-06-12 NOTE — Progress Notes (Signed)
Pt had a lap on Jan.28.2013 by Dr Macon Large for endometrosis-had a hysterectomy-now has increased abd pain

## 2011-06-12 NOTE — Telephone Encounter (Signed)
Pt called stating had hysterectomy on 05-19-11 and is having a lot of pain. Pt states is taking ibuprofen and is not helping and also has pain contract with Dr Macon Large and does not want to break it. Spoke with Dr. Macon Large and was told to advise pt to come to MAU for evaluation. Telephone pt and advised to come to MAU for evaluation. Pt voiced understanding.

## 2011-06-12 NOTE — ED Provider Notes (Signed)
History:  26 y.o. P2 seen in MAU today for evaluation of postoperative pain after TLH/RSO on 05/19/11 done for severe endometriosis and chronic pelvic pain. She has finished 40 tablets of Oxycodone 10 mg given to her on 05/27/11.  She reports that she believes she has a problem with narcotic dependence and she needs help.  She describes having increased pain in the last few days after lifting her daughter and "doing too much at home".  However, she also admits that she gets very agitated, nauseated, shaky and recognizes these are signs of narcotic dependence.  These symptoms were alleviated by her mother giving her Percocet tablets.  She denies any fevers, bleeding, incisional drainage, vomiting or other postoperative symptoms.  Patient also reports having a headache and elevated BP in 140s-150s/90-110s at home.  She has been treated with HCTZ in the past which she claims did not help with her HTN.  She wants to try another medication.  The following portions of the patient's history were reviewed and updated as appropriate: allergies, current medications, past family history, past medical history, past social history, past surgical history and problem list.  Patient Active Problem List  Diagnoses  . DEPRESSION, MAJOR, MODERATE  . PANIC DISORDER WITH AGORAPHOBIA  . HYPERTENSION, BENIGN  . Tobacco abuse  . Endometriosis  . S/P total laparoscopic hysterectomy and bilateral salpingo-oophorectomy for endometriosis  . Narcotic depedence     Objective:  Physical Exam Blood pressure 120/94, pulse 97, temperature 98.5 F (36.9 C), temperature source Oral, resp. rate 20, height 5\' 4"  (1.626 m), weight 58.968 kg (130 lb), last menstrual period 04/15/2011. Repeat BP 138/101. Gen: NAD Abd: Soft, nontender and non-distended.  Well-healed incisions.  Pelvic: Deferred  Assessment & Plan:   1) Narcotic Dependence and Withdrawal: Emphasized that this is a serious problem which needs addressing and needs  specialized help.  Patient agrees.  Will refer to Pain Clinic, she will be contacted with details about the referral.  In the meantime, she was given a script for Oxycodone 10 mg po q6h prn pain; #60 tablets.  This should last for at least two weeks and hopefully will have a referral by then.  Patient admits she has a narcotic dependence.  She was strongly told not to get any narcotics from any other source including her mother. As soon as she becomes a patient at the pain clinic; no further narcotic prescriptions will come from me or any one else at our GYN clinic.  Patient is also on Xanax and is likely dependent on this too.  2) HTN: Will give Rx for Norvasc 5mg  po daily and have her follow up with Dr. Clementeen Graham for further management of her HTN.  Jaynie Collins, M.D. 06/12/2011 9:09 PM

## 2011-06-12 NOTE — Progress Notes (Signed)
C/o pain worsened 2 days, states is doing more at home since husband went back to work, lifting 26 y/o daughter and driving.

## 2011-06-16 ENCOUNTER — Telehealth: Payer: Self-pay | Admitting: *Deleted

## 2011-06-16 DIAGNOSIS — F1120 Opioid dependence, uncomplicated: Secondary | ICD-10-CM

## 2011-06-16 DIAGNOSIS — Z90710 Acquired absence of both cervix and uterus: Secondary | ICD-10-CM

## 2011-06-16 NOTE — Telephone Encounter (Signed)
PLEASE!!! [id number]. She NEEDS it! See my MAU note for details. Thanks!  Have a nice weekend.  [facility] for pain [phone] do not accept pts for opoid addiction or pelvic pain. Telephoned [facility]Pelvic Pain [phone] said each visit would be $150 and that only includes seeing the Dr not the tests or any scans or bloodwork and pt would be respossible. Gave me the number to [facility] [phone] and would be able to help pt at a discounted price due to no insurance. Telephoned spoke to [place]. Need to fax all notes and they would be reviewed by Dr and they would call pt with appt. Fax # [phone]. Will fax all info to Dr. Gloris Manchester pt #'s left message to return call.

## 2011-06-17 ENCOUNTER — Encounter (HOSPITAL_COMMUNITY): Payer: Self-pay

## 2011-06-17 NOTE — Telephone Encounter (Signed)
[facility] Returned call and stated could not accept anymore new patients at this time. Gave number to pain clinic [phone] to try to get appointment for patient.

## 2011-06-19 ENCOUNTER — Ambulatory Visit: Payer: Self-pay | Admitting: Obstetrics & Gynecology

## 2011-06-26 NOTE — Telephone Encounter (Signed)
Return call received from [place] @ Pain clinic in [place]. She stated to fax the pt records to them @ [phone] and they will review records, then contact pt directly if they are able to see the pt.

## 2011-06-26 NOTE — Telephone Encounter (Signed)
Telephoned pain clinic at left message to return call.

## 2011-07-03 MED ORDER — OXYCODONE HCL 10 MG PO TABS: 10.0000 mg | ORAL_TABLET | Freq: Four times a day (QID) | ORAL | Status: DC | PRN

## 2011-07-03 NOTE — Telephone Encounter (Signed)
Patient requesting refill of oxycodone until she gets into pain clinic. Verified with other staff that referral was faxed only today to pain clinic in [place]. Notified Dr. Jolayne Panther and requested advice. Per Dr. Jolayne Panther ok to Refill Oxycodone HCL 10 mg Tablets PO q6h Prn for pain #30 no refill; hard copy at front desk for pick up. Patient understands that this is only to cover until she gets into pain clinic. She is to call us mid-next week if she hasn't heard from the pain clinic. Patient agrees to all.

## 2011-07-03 NOTE — Telephone Encounter (Signed)
All info faxed to pain clinic for review.

## 2011-07-14 ENCOUNTER — Other Ambulatory Visit: Payer: Self-pay | Admitting: *Deleted

## 2011-07-14 DIAGNOSIS — Z90710 Acquired absence of both cervix and uterus: Secondary | ICD-10-CM

## 2011-07-14 DIAGNOSIS — F1120 Opioid dependence, uncomplicated: Secondary | ICD-10-CM

## 2011-07-14 NOTE — Telephone Encounter (Signed)
Patient called and left a messge stating she is a patient of Dr. Macon Large and " they are supposed to be trying to get me into the pain clinic. States she filled my prescription and told me try to make that the last one because trying to get into the pain clinic.  States I only have 2 pills left and will be out. States she has not heard from the pain clinic. States I don't know what to do. States she knows why I on the med and I go thru withdrawal if I run out.  States please call me and tell me what to do.  I have a 26 year old to take care of.

## 2011-07-15 NOTE — Telephone Encounter (Signed)
I called the pain clinic in [place] and left a message on their nurse line. I requesting them to call back with information of whether or not they will be able to schedule [name] for an appt. Her information was faxed on 07/03/11.

## 2011-07-16 ENCOUNTER — Other Ambulatory Visit: Payer: Self-pay | Admitting: *Deleted

## 2011-07-16 MED ORDER — OXYCODONE HCL 10 MG PO TABS: 10.0000 mg | ORAL_TABLET | Freq: Four times a day (QID) | ORAL | Status: DC | PRN

## 2011-07-16 NOTE — Telephone Encounter (Signed)
Will give patient sixty tablets which should last her about four weeks.  Hopefully, she will get an appointment soon with the Pain clinic for her narcotic dependence management.  Will continue to give her pain medications until she is under the care of the Pain Clinic. She has undergone definitive surgical management for endometriosis; surgery was uncomplicated.  Jaynie Collins, M.D. 07/16/2011 1:59 PM

## 2011-07-16 NOTE — Telephone Encounter (Signed)
Received call from [facility] @ the pain clinic. She stated that they did not have any records on file for [name]. Information was re-faxed today to [phone].  They will call pt directly with appt info or send letter to our office if unable to see the pt.  I left a message for [name] that I will be able to speak with Dr. Macon Large this afternoon regarding her refill request and I have re-faxed her information to the pain clinic.

## 2011-07-16 NOTE — Telephone Encounter (Signed)
Called pt and informed her that Rx is ready for pick up. She was also informed that Dr. Macon Large intends for this Rx to last atleast 4 wks. She will not be able to receive another Rx until that time and hopefully will be under the care and supervision of the pain clinic in [place]. Pt voiced understanding.

## 2011-07-16 NOTE — Telephone Encounter (Signed)
Called and left additional message on nurse line @ the pain clinic. I asked them to call me back and provide a status update on the referral for [name].

## 2011-07-21 ENCOUNTER — Telehealth: Payer: Self-pay | Admitting: Obstetrics and Gynecology

## 2011-07-21 NOTE — Telephone Encounter (Signed)
Called pt and she informed me that she has went out of town for Easter and she left her pain medication at home what can she do?  I informed pt that there is nothing that we can do on our end because we can not write another Rx for pain medication.  The pt then asks if she could get her grandmother to turn in her pills to the pharmacy and then get a new Rx.  I informed pt that the pharmacy would not allow a return of used pain pills and that once they leave the facility they can not be returned.  Pt stated "so I will have drive 5 hours to get my medication".  I advised pt that it would be best cause there is nothing that we can and I apologized for the inconvenience.  Pt stated understanding and had no further questions.

## 2011-07-21 NOTE — Telephone Encounter (Signed)
Patient called in regard of her pain med problem.

## 2011-07-23 ENCOUNTER — Telehealth: Payer: Self-pay | Admitting: *Deleted

## 2011-07-23 DIAGNOSIS — F419 Anxiety disorder, unspecified: Secondary | ICD-10-CM

## 2011-07-23 NOTE — Telephone Encounter (Signed)
Pt left message requesting refill of Alprazolam. She states that she is having to take 2 tabs @ night in order to sleep- wants greater quantity than 1 tab per Rachel Mcdaniel. She states " I am having really bad anxiety today.''  Request routed to Dr. Macon Large for consideration.

## 2011-07-25 MED ORDER — ALPRAZOLAM 0.5 MG PO TABS: 0.5000 mg | ORAL_TABLET | Freq: Three times a day (TID) | ORAL | Status: DC | PRN

## 2011-07-25 NOTE — Telephone Encounter (Addendum)
I called pt and lef t message on her personal voice mail that a Rx refill has been approved by Dr. Macon Large. She has been given additional tablets to account for 2 needed @ HS @ times. She should still just take 1 at a time during the [place]. I will call in the Rx to her pharmacy.   Rx called to Pharmacist "Johny Drilling" @ 1125.

## 2011-07-25 NOTE — Telephone Encounter (Signed)
Addended by: Jaynie Collins A on: 07/25/2011 10:30 AM   Modules accepted: Orders

## 2011-07-25 NOTE — Telephone Encounter (Signed)
Alprazolam 0.5 mg  po tid prn anxiety or insomnia.  Prescription printed out. Patient needs mental health provider to take care of her anxiety.

## 2011-08-07 ENCOUNTER — Telehealth: Payer: Self-pay | Admitting: Obstetrics and Gynecology

## 2011-08-07 DIAGNOSIS — Z90710 Acquired absence of both cervix and uterus: Secondary | ICD-10-CM

## 2011-08-07 DIAGNOSIS — F1120 Opioid dependence, uncomplicated: Secondary | ICD-10-CM

## 2011-08-07 MED ORDER — OXYCODONE HCL 10 MG PO TABS: 10.0000 mg | ORAL_TABLET | Freq: Four times a day (QID) | ORAL | Status: DC | PRN

## 2011-08-07 NOTE — Telephone Encounter (Signed)
Refill printed.  Please inform patient that this should last her for about a month, when taken in combination with Ibuprofen.

## 2011-08-07 NOTE — Telephone Encounter (Signed)
Patient called for Rx refill of oxycodone. pls advice - (routed to Dr. Macon Large).

## 2011-08-11 ENCOUNTER — Other Ambulatory Visit: Payer: Self-pay | Admitting: Obstetrics & Gynecology

## 2011-08-11 DIAGNOSIS — Z90710 Acquired absence of both cervix and uterus: Secondary | ICD-10-CM

## 2011-08-11 DIAGNOSIS — F1120 Opioid dependence, uncomplicated: Secondary | ICD-10-CM

## 2011-08-11 MED ORDER — OXYCODONE HCL 10 MG PO TABS: 10.0000 mg | ORAL_TABLET | Freq: Four times a day (QID) | ORAL | Status: DC | PRN

## 2011-08-11 NOTE — Telephone Encounter (Signed)
[name] called again and left a message today at 2:20 stating this is her 3rd call and she hasn't received a call back. States she was told Dr. Macon Large would write a rx and it may be ready for pick up today.  Discussed with staff and this is being worked on .

## 2011-08-11 NOTE — Telephone Encounter (Signed)
Discussed with Dr. Macon Large- , patient did not get prescription last week, Dr. Macon Large not in clinic today, will ask another provider to writed prescription. Called [name] and informed her prescription is ready- she states she can't come now, husband has car, will send brother-in-law to pick up rxTruett Mcdaniel)

## 2011-08-11 NOTE — Telephone Encounter (Signed)
Patient called again on call back line at 1:23 asking about Rx.

## 2011-08-18 ENCOUNTER — Encounter: Payer: Self-pay | Admitting: *Deleted
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
## 2011-08-21 ENCOUNTER — Encounter: Payer: Self-pay | Admitting: *Deleted

## 2011-09-12 ENCOUNTER — Ambulatory Visit: Payer: Self-pay | Admitting: Obstetrics & Gynecology

## 2011-10-31 ENCOUNTER — Ambulatory Visit (INDEPENDENT_AMBULATORY_CARE_PROVIDER_SITE_OTHER): Payer: Self-pay | Admitting: Family Medicine

## 2011-10-31 VITALS — BP 126/78 | HR 87 | Temp 98.0°F | Resp 14 | Ht 65.0 in | Wt 144.0 lb

## 2011-10-31 DIAGNOSIS — F419 Anxiety disorder, unspecified: Secondary | ICD-10-CM

## 2011-10-31 DIAGNOSIS — K529 Noninfective gastroenteritis and colitis, unspecified: Secondary | ICD-10-CM

## 2011-10-31 DIAGNOSIS — G8929 Other chronic pain: Secondary | ICD-10-CM

## 2011-10-31 DIAGNOSIS — N809 Endometriosis, unspecified: Secondary | ICD-10-CM

## 2011-10-31 DIAGNOSIS — F411 Generalized anxiety disorder: Secondary | ICD-10-CM

## 2011-10-31 DIAGNOSIS — K5289 Other specified noninfective gastroenteritis and colitis: Secondary | ICD-10-CM

## 2011-10-31 MED ORDER — ALPRAZOLAM 1 MG PO TABS: 1.0000 mg | ORAL_TABLET | Freq: Three times a day (TID) | ORAL | Status: AC | PRN

## 2011-10-31 MED ORDER — OXYCODONE-ACETAMINOPHEN 10-325 MG PO TABS: 1.0000 | ORAL_TABLET | Freq: Two times a day (BID) | ORAL | Status: AC | PRN

## 2011-10-31 MED ORDER — METRONIDAZOLE 250 MG PO TABS: 250.0000 mg | ORAL_TABLET | Freq: Three times a day (TID) | ORAL | Status: AC

## 2011-10-31 NOTE — Progress Notes (Signed)
26 yo woman who just came back from [place] cruise (honeymoon), and has had 5 days of diarrhea with cramps.  She takes oxycodone for endometriosis and alprazolam for anxiety disorder. S/P hysterectomy for endometriosis.  Objective:  NAD Skin:  No jaundice Heart: reg, no murmur Chest:  Clear Abdomen: soft without significant guarding or rebound, no HSM Ext:  Normal  Assessment:  Acute gastroenteritis  Plan:  Metronidazole culturelle

## 2011-10-31 NOTE — Patient Instructions (Signed)
Culturelle twice a day   Diarrhea Infections caused by germs (bacterial) or a virus commonly cause diarrhea. Your caregiver has determined that with time, rest and fluids, the diarrhea should improve. In general, eat normally while drinking more water than usual. Although water may prevent dehydration, it does not contain salt and minerals (electrolytes). Broths, weak tea without caffeine and oral rehydration solutions (ORS) replace fluids and electrolytes. Small amounts of fluids should be taken frequently. Large amounts at one time may not be tolerated. Plain water may be harmful in infants and the elderly. Oral rehydrating solutions (ORS) are available at pharmacies and grocery stores. ORS replace water and important electrolytes in proper proportions. Sports drinks are not as effective as ORS and may be harmful due to sugars worsening diarrhea.  ORS is especially recommended for use in children with diarrhea. As a general guideline for children, replace any new fluid losses from diarrhea and/or vomiting with ORS as follows:   If your child weighs 22 pounds or under (10 kg or less), give 60-120 mL ( -  cup or 2 - 4 ounces) of ORS for each episode of diarrheal stool or vomiting episode.   If your child weighs more than 22 pounds (more than 10 kgs), give 120-240 mL ( - 1 cup or 4 - 8 ounces) of ORS for each diarrheal stool or episode of vomiting.   While correcting for dehydration, children should eat normally. However, foods high in sugar should be avoided because this may worsen diarrhea. Large amounts of carbonated soft drinks, juice, gelatin desserts and other highly sugared drinks should be avoided.   After correction of dehydration, other liquids that are appealing to the child may be added. Children should drink small amounts of fluids frequently and fluids should be increased as tolerated. Children should drink enough fluids to keep urine clear or pale yellow.   Adults should eat  normally while drinking more fluids than usual. Drink small amounts of fluids frequently and increase as tolerated. Drink enough fluids to keep urine clear or pale yellow. Broths, weak decaffeinated tea, lemon lime soft drinks (allowed to go flat) and ORS replace fluids and electrolytes.   Avoid:   Carbonated drinks.   Juice.   Extremely hot or cold fluids.   Caffeine drinks.   Fatty, greasy foods.   Alcohol.   Tobacco.   Too much intake of anything at one time.   Gelatin desserts.   Probiotics are active cultures of beneficial bacteria. They may lessen the amount and number of diarrheal stools in adults. Probiotics can be found in yogurt with active cultures and in supplements.   Wash hands well to avoid spreading bacteria and virus.   Anti-diarrheal medications are not recommended for infants and children.   Only take over-the-counter or prescription medicines for pain, discomfort or fever as directed by your caregiver. Do not give aspirin to children because it may cause Reye's Syndrome.   For adults, ask your caregiver if you should continue all prescribed and over-the-counter medicines.   If your caregiver has given you a follow-up appointment, it is very important to keep that appointment. Not keeping the appointment could result in a chronic or permanent injury, and disability. If there is any problem keeping the appointment, you must call back to this facility for assistance.  SEEK IMMEDIATE MEDICAL CARE IF:   You or your child is unable to keep fluids down or other symptoms or problems become worse in spite of treatment.   Vomiting  or diarrhea develops and becomes persistent.   There is vomiting of blood or bile (green material).   There is blood in the stool or the stools are black and tarry.   There is no urine output in 6-8 hours or there is only a small amount of very dark urine.   Abdominal pain develops, increases or localizes.   You have a fever.    Your baby is older than 3 months with a rectal temperature of 102 F (38.9 C) or higher.   Your baby is 26 months old or younger with a rectal temperature of 100.4 F (38 C) or higher.   You or your child develops excessive weakness, dizziness, fainting or extreme thirst.   You or your child develops a rash, stiff neck, severe headache or become irritable or sleepy and difficult to awaken.  MAKE SURE YOU:   Understand these instructions.   Will watch your condition.   Will get help right away if you are not doing well or get worse.  Document Released: 03/28/2002 Document Revised: 03/27/2011 Document Reviewed: 02/12/2009 [facility] Patient Information 2012 [place], [place].

## 2011-11-03 ENCOUNTER — Telehealth: Payer: Self-pay

## 2011-11-03 NOTE — Telephone Encounter (Signed)
The patient requests a return call to discuss her medications and get another Rx for pain medicine since her referral appt is not until 12/10/11.  The patient was give Percocet 10/325mg  # 20, but this will not cover her until her appt with the specialist.  Please call patient at [phone].

## 2011-11-04 ENCOUNTER — Ambulatory Visit (INDEPENDENT_AMBULATORY_CARE_PROVIDER_SITE_OTHER): Payer: BC Managed Care – PPO | Admitting: Family Medicine

## 2011-11-04 VITALS — BP 160/100 | HR 104 | Temp 99.0°F | Resp 16 | Ht 65.5 in | Wt 149.0 lb

## 2011-11-04 DIAGNOSIS — G894 Chronic pain syndrome: Secondary | ICD-10-CM

## 2011-11-04 MED ORDER — OXYCODONE-ACETAMINOPHEN 10-325 MG PO TABS: ORAL_TABLET | ORAL | Status: DC

## 2011-11-04 NOTE — Telephone Encounter (Signed)
Please get details. 

## 2011-11-04 NOTE — Progress Notes (Signed)
Rachel Mcdaniel Called Pt asking to have prescription filled prior to 11/10/11 as indicated on RX.  She sates that she is going out of town.   Per Dr. Alwyn Ren, this can not be filled early. I relayed this information to the pharmacist

## 2011-11-04 NOTE — Progress Notes (Signed)
Subjective: Patient is concerned because she has used 7 of the pain pills she was given, and the pain clinic appointment that was made for her is not until August 21. She has been using pain medications intermittently for a long time, for endometriosis pain, for which she has had 3 surgeries. She doesn't know what she will do between now and the appointment. She apparently feels like that Dr. Mindi Junker had not understood things and gave her a lower dose of the medicine that she had been on previously. His note separately documents that she was given the pain pills, and that was done so with the understanding that she would get in with the other doctor in the near future.  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 I am very concerned that she has been using enough pain medications that she is indeed dependent on them. I do not know the solution for her endometriosis pain, but she needs to consider going back to another GYN to get this straightened out. I am going to give her enough pain medicine for Percocet 10/325 one daily #30 on a when necessary basis, that she needs to make last until she sees a specialist. She is instructed to go without pain medicine on days that she can, for which she uses 2 pills in one day she will not have enough. Her goal needs to be to get off all regular narcotic pain use.

## 2011-11-04 NOTE — Telephone Encounter (Signed)
Patient here today and will be seen to discuss with a physician.

## 2011-11-04 NOTE — Telephone Encounter (Signed)
Pt reports that when she saw Dr L, she brought in the bottle of medication she was currently taking that was controlling her pain, and it was different than the Percocet 10/325 that was Rxd. When triaged, MA could not take out the listing of Percocet that was in computer that she was on at an earlier time, but she showed bottle to Dr L and discussed and he told her that he would Rx it for her until she could get in for referral. Pt normally takes plain oxycodone 10 mg (Roxicet) w/out Tylenol one tab Q 6 hr. He stated he was going to give her #30 and thought he could get her referral w/in 10 days. When pt got the Rx filled she realized it was the wrong Rx and she thinks he just Rfd what was in computer instead of changing to what was on her bottle. She has been having to take the Percocet Q 6 hrs sometimes and only has #9 left. Can we change to her reg Rx and RF until her appt? She will RTC to see another provider if needed since Dr L is not back until 7/21, but please just let her know.

## 2011-11-04 NOTE — Patient Instructions (Addendum)
Minimize pain medicine use. Make sure you keep your appointment with the other clinic in August  From my note: "A long discussion with the patient. I am very concerned that she has been using enough pain medications that she is indeed dependent on them. I do not know the solution for her endometriosis pain, but she needs to consider going back to another GYN to get this straightened out. I am going to give her enough pain medicine for Percocet 10/325 one daily #30 on a when necessary basis, that she needs to make last until she sees a specialist. She is instructed to go without pain medicine on days that she can, for which she uses 2 pills in one day she will not have enough. Her goal needs to be to get off all regular narcotic pain use."

## 2011-11-05 ENCOUNTER — Ambulatory Visit: Payer: Medicaid Other | Admitting: Family Medicine

## 2011-11-09 ENCOUNTER — Telehealth: Payer: Self-pay | Admitting: Radiology

## 2011-11-09 NOTE — Telephone Encounter (Signed)
Patient called in and wants early renewal on her Oxycodone / using [employer], it is dated to pickup tomorrow but she wants today. She tells me she went on trip to [place] on 7/6,and caught something when she was there. She states only one day early she has the Rx [id number].

## 2011-11-10 NOTE — Telephone Encounter (Signed)
This should be resolved as it is the 22nd today. If she caught something in the [place], she will need an OV for evaluation.

## 2011-11-10 NOTE — Telephone Encounter (Signed)
LMOM that we received her mes from yesterday afternoon and expect that her RF has been resolved since it was due for RF today. Advised pt to CB if she is still having problems w/her RF and that we will be happy to see her if she needs eval for current Sxs.

## 2011-12-10 ENCOUNTER — Ambulatory Visit: Payer: Medicaid Other | Admitting: Internal Medicine

## 2012-01-14 ENCOUNTER — Encounter (HOSPITAL_BASED_OUTPATIENT_CLINIC_OR_DEPARTMENT_OTHER): Payer: Self-pay

## 2012-01-14 ENCOUNTER — Telehealth: Payer: Self-pay | Admitting: Medical

## 2012-01-14 ENCOUNTER — Emergency Department (HOSPITAL_BASED_OUTPATIENT_CLINIC_OR_DEPARTMENT_OTHER)
Admission: EM | Admit: 2012-01-14 | Discharge: 2012-01-14 | Disposition: A | Discharge status: Home / Self Care | Payer: BC Managed Care – PPO | Attending: Emergency Medicine | Admitting: Emergency Medicine

## 2012-01-14 DIAGNOSIS — Z8249 Family history of ischemic heart disease and other diseases of the circulatory system: Secondary | ICD-10-CM | POA: Insufficient documentation

## 2012-01-14 DIAGNOSIS — R1032 Left lower quadrant pain: Secondary | ICD-10-CM | POA: Insufficient documentation

## 2012-01-14 DIAGNOSIS — Z8489 Family history of other specified conditions: Secondary | ICD-10-CM | POA: Insufficient documentation

## 2012-01-14 DIAGNOSIS — I10 Essential (primary) hypertension: Secondary | ICD-10-CM | POA: Insufficient documentation

## 2012-01-14 DIAGNOSIS — Z809 Family history of malignant neoplasm, unspecified: Secondary | ICD-10-CM | POA: Insufficient documentation

## 2012-01-14 DIAGNOSIS — Z87891 Personal history of nicotine dependence: Secondary | ICD-10-CM | POA: Insufficient documentation

## 2012-01-14 DIAGNOSIS — Z818 Family history of other mental and behavioral disorders: Secondary | ICD-10-CM | POA: Insufficient documentation

## 2012-01-14 DIAGNOSIS — F329 Major depressive disorder, single episode, unspecified: Secondary | ICD-10-CM | POA: Insufficient documentation

## 2012-01-14 DIAGNOSIS — F411 Generalized anxiety disorder: Secondary | ICD-10-CM | POA: Insufficient documentation

## 2012-01-14 DIAGNOSIS — G8929 Other chronic pain: Secondary | ICD-10-CM | POA: Insufficient documentation

## 2012-01-14 DIAGNOSIS — F3289 Other specified depressive episodes: Secondary | ICD-10-CM | POA: Insufficient documentation

## 2012-01-14 LAB — CBC WITH DIFFERENTIAL/PLATELET
HCT: 37.7 % (ref 36.0–46.0)
Hemoglobin: 12.6 g/dL (ref 12.0–15.0)
Lymphocytes Relative: 41 % (ref 12–46)
Lymphs Abs: 3 10*3/uL (ref 0.7–4.0)
MCHC: 33.4 g/dL (ref 30.0–36.0)
Monocytes Absolute: 0.7 10*3/uL (ref 0.1–1.0)
Monocytes Relative: 9 % (ref 3–12)
Neutro Abs: 3.5 10*3/uL (ref 1.7–7.7)
RBC: 4.12 MIL/uL (ref 3.87–5.11)
WBC: 7.5 10*3/uL (ref 4.0–10.5)

## 2012-01-14 LAB — URINALYSIS, ROUTINE W REFLEX MICROSCOPIC
Glucose, UA: NEGATIVE mg/dL
Ketones, ur: NEGATIVE mg/dL
Leukocytes, UA: NEGATIVE
Specific Gravity, Urine: 1.023 (ref 1.005–1.030)
pH: 6 (ref 5.0–8.0)

## 2012-01-14 LAB — BASIC METABOLIC PANEL
BUN: 11 mg/dL (ref 6–23)
CO2: 26 mEq/L (ref 19–32)
Chloride: 101 mEq/L (ref 96–112)
Creatinine, Ser: 0.6 mg/dL (ref 0.50–1.10)
Glucose, Bld: 100 mg/dL — ABNORMAL HIGH (ref 70–99)
Potassium: 4.3 mEq/L (ref 3.5–5.1)

## 2012-01-14 MED ORDER — CYCLOBENZAPRINE HCL 10 MG PO TABS
10.0000 mg | ORAL_TABLET | Freq: Once | ORAL | Status: AC
  Administered 2012-01-14: 10 mg via ORAL
  Filled 2012-01-14: qty 1

## 2012-01-14 MED ORDER — CYCLOBENZAPRINE HCL 10 MG PO TABS: 10.0000 mg | ORAL_TABLET | Freq: Two times a day (BID) | ORAL | Status: DC | PRN

## 2012-01-14 NOTE — ED Provider Notes (Signed)
History     CSN: [id number]  Arrival date & time 01/14/12  1731   First MD Initiated Contact with Patient 01/14/12 1746      Chief Complaint  Patient presents with  . Abdominal Pain    (Consider location/radiation/quality/duration/timing/severity/associated sxs/prior treatment) Patient is a 26 y.o. female presenting with abdominal pain. The history is provided by the patient.  Abdominal Pain The primary symptoms of the illness include abdominal pain.  She had a hysterectomy done in February of this year. 2 weeks ago, she noticed a nodule in her left suprapubic area which is tender. There is associated muscle spasm and some pain radiates down into her left leg. Pain is moderate and she rates it at 1710. She took naproxen which did initially give her some relief but is now no longer giving her relief. She is having difficulty sleeping at if she lays on that side. There is no associated fever, chills, sweats. She denies nausea, vomiting, diarrhea. She denies urinary urgency, frequency, tenesmus, dysuria.  Past Medical History  Diagnosis Date  . 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 Tobacco abuse   . Endometriosis   . HTN (hypertension)     Hx PIH w/ preg; now borderline - no meds  . Anemia     history  . Anxiety   . Headache     OTC meds prn  . Depression     History - no meds    Past Surgical History  Procedure Date  . Exploratory laparotomy 11/11/07    LOA, Right ovarian cystectomy  . Diagnostic laparoscopy 05/16/10    Convert Exp Laparotomy, LOA, Fulguration of endometriosis, Right oophorectomy  . Svd 2010    x 1  . Laparoscopic hysterectomy 05/19/2011    Procedure: HYSTERECTOMY TOTAL LAPAROSCOPIC;  Surgeon: Tereso Newcomer, MD;  Location: [facility];  Service: Gynecology;  Laterality: N/A;  . Salpingoophorectomy 05/19/2011    Procedure: SALPINGO OOPHERECTOMY;  Surgeon: Tereso Newcomer, MD;  Location: [facility];  Service: Gynecology;  Laterality: Bilateral;  . Cystoscopy 05/19/2011      Procedure: CYSTOSCOPY;  Surgeon: Tereso Newcomer, MD;  Location: [facility];  Service: Gynecology;  Laterality: N/A;  . Abdominal hysterectomy     Family History  Problem Relation Age of Onset  . Hypertension Father   . Depression Mother   . Lupus Mother   . Cancer Mother     History  Substance Use Topics  . Smoking status: Former Smoker    Types: Cigarettes    Quit date: 09/10/2007  . Smokeless tobacco: Never Used   Comment: down to 1 cig per day  . Alcohol Use: No    OB History    Grav Para Term Preterm Abortions TAB SAB Ect Mult Living   1 1 1       1       Review of Systems  Gastrointestinal: Positive for abdominal pain.  All other systems reviewed and are negative.    Allergies  Review of patient's allergies indicates no known allergies.  Home Medications  No current outpatient prescriptions on file.  BP 154/107  Pulse 121  Temp 98.6 F (37 C)  Resp 18  SpO2 100%  LMP 04/15/2011  Physical Exam  Nursing note and vitals reviewed. 26 year old female, resting comfortably and in no acute distress. Vital signs are significant for tachycardia with heart rate of 121, and hypertension with blood pressure 154/107. Oxygen saturation is 100%, which is normal. Head is normocephalic  and atraumatic. PERRLA, EOMI. Oropharynx is clear. Neck is nontender and supple without adenopathy or JVD. Back is nontender and there is no CVA tenderness. Lungs are clear without rales, wheezes, or rhonchi. Chest is nontender. Heart has regular rate and rhythm without murmur. Abdomen is soft, flat. There is a 1 cm nodule in the left suprapubic area which is fairly mobile. There is overlying erythema or warmth. No other tenderness is identified. There is no rebound or guarding. Peristalsis is normoactive. Extremities have no cyanosis or edema, full range of motion is present. Skin is warm and dry without rash. Neurologic: Mental status is normal, cranial nerves are intact, there are no  motor or sensory deficits.   ED Course  Procedures (including critical care time)  Results for orders placed during the hospital encounter of 01/14/12  URINALYSIS, ROUTINE W REFLEX MICROSCOPIC      Component Value Range   Color, Urine YELLOW  YELLOW   APPearance CLEAR  CLEAR   Specific Gravity, Urine 1.023  1.005 - 1.030   pH 6.0  5.0 - 8.0   Glucose, UA NEGATIVE  NEGATIVE mg/dL   Hgb urine dipstick NEGATIVE  NEGATIVE   Bilirubin Urine NEGATIVE  NEGATIVE   Ketones, ur NEGATIVE  NEGATIVE mg/dL   Protein, ur NEGATIVE  NEGATIVE mg/dL   Urobilinogen, UA 0.2  0.0 - 1.0 mg/dL   Nitrite NEGATIVE  NEGATIVE   Leukocytes, UA NEGATIVE  NEGATIVE  CBC WITH DIFFERENTIAL      Component Value Range   WBC 7.5  4.0 - 10.5 K/uL   RBC 4.12  3.87 - 5.11 MIL/uL   Hemoglobin 12.6  12.0 - 15.0 g/dL   HCT 78.2  95.6 - 21.3 %   MCV 91.5  78.0 - 100.0 fL   MCH 30.6  26.0 - 34.0 pg   MCHC 33.4  30.0 - 36.0 g/dL   RDW 08.6  57.8 - 46.9 %   Platelets 312  150 - 400 K/uL   Neutrophils Relative 48  43 - 77 %   Neutro Abs 3.5  1.7 - 7.7 K/uL   Lymphocytes Relative 41  12 - 46 %   Lymphs Abs 3.0  0.7 - 4.0 K/uL   Monocytes Relative 9  3 - 12 %   Monocytes Absolute 0.7  0.1 - 1.0 K/uL   Eosinophils Relative 3  0 - 5 %   Eosinophils Absolute 0.2  0.0 - 0.7 K/uL   Basophils Relative 1  0 - 1 %   Basophils Absolute 0.0  0.0 - 0.1 K/uL  BASIC METABOLIC PANEL      Component Value Range   Sodium 139  135 - 145 mEq/L   Potassium 4.3  3.5 - 5.1 mEq/L   Chloride 101  96 - 112 mEq/L   CO2 26  19 - 32 mEq/L   Glucose, Bld 100 (*) 70 - 99 mg/dL   BUN 11  6 - 23 mg/dL   Creatinine, Ser 6.29  0.50 - 1.10 mg/dL   Calcium 52.8  8.4 - 41.3 mg/dL   GFR calc non Af Amer >90  >90 mL/min   GFR calc Af Amer >90  >90 mL/min     1. Abdominal pain, left lower quadrant       MDM  Nodule which is probably an inflammatory granuloma related to her surgery. CBC will be checked. I reviewed her past records and she has  had difficulty with narcotic abuse. Her record on [place] controlled  substance reporting website shows no narcotic prescriptions since July 26. She will need to be referred back to her obstetrician since I feel this is likely related to her surgery. Workup is unremarkable. She is given a prescription for cyclobenzaprine and she had been complaining of muscle spasms. No narcotic prescriptions are given, and she was requesting narcotic prescriptions.        Dione Booze, MD 01/14/12 Barry Brunner

## 2012-01-14 NOTE — Telephone Encounter (Signed)
Patient called stating that she is 6 weeks post op after ectopic pregnancy. She is now pregnant and would like advice.

## 2012-01-14 NOTE — ED Notes (Signed)
Patient here with LLQ pain x 1 week, had full hysterectomy in 2/13. Reports that the pain is radiating down left leg. Denies urinary symptoms

## 2012-01-14 NOTE — ED Notes (Signed)
MD at bedside. 

## 2012-01-15 NOTE — Telephone Encounter (Signed)
Patient went to ER last night.

## 2012-03-29 ENCOUNTER — Ambulatory Visit: Payer: BC Managed Care – PPO | Admitting: Family Medicine

## 2012-03-29 VITALS — BP 145/100 | HR 111 | Temp 98.2°F | Resp 16 | Ht 63.0 in | Wt 164.0 lb

## 2012-03-29 DIAGNOSIS — F419 Anxiety disorder, unspecified: Secondary | ICD-10-CM

## 2012-03-29 MED ORDER — ALPRAZOLAM 1 MG PO TABS: 1.0000 mg | ORAL_TABLET | Freq: Two times a day (BID) | ORAL | Status: DC | PRN

## 2012-03-29 NOTE — Patient Instructions (Addendum)
Please be sparing with your xanax as it can be habit forming. Please give you spine surgeon a call about your chronic back pain.  Your blood pressure and pulse are elevated today likely due to anxiety.  However, I would have these rechecked in the next few weeks

## 2012-03-29 NOTE — Progress Notes (Signed)
[facility] [address] [place] [address] [phone]  Date:  03/29/2012   Name:  Rachel Mcdaniel   DOB:  14-Apr-1986   MRN:  [id number]  PCP:  Janace Hoard, MD    Chief Complaint: Medication Refill   History of Present Illness:  Rachel Mcdaniel is a 26 y.o. very pleasant female patient who presents with the following:  She is here today "to have my prescriptions refilled."  She had a hysterectomy in February due to endometriosis.  She states that she is still in pain from this operation, and also from "having my back broken" in the spring- this was treated in [place], so we do not have these records available.  However she has apparently also seen a surgeon here in [place]- she did not need surgery, and was told that "back pain will just be a problem for me."    She did not ever have an appt with a pain clinic (states she was told that they were full), and has not followed up with her surgeon as of late.  Her grandfather died last night, and she is feeling upset and "having panic attacks."  She requests to start back on xanax, percocet and soma.  I do not see where she has received Soma lately, and we last gave her her percocet in July.  At that time Dr. Alwyn Ren was concerned about her use of narcotics and recommended that she start pain management.    We did give her #90 xanax 1mg  about one month ago.    Patient Active Problem List  Diagnosis  . DEPRESSION, MAJOR, MODERATE  . PANIC DISORDER WITH AGORAPHOBIA  . HYPERTENSION, BENIGN  . Tobacco abuse  . Endometriosis  . S/P total laparoscopic hysterectomy and bilateral salpingo-oophorectomy for endometriosis  . Narcotic depedence    Past Medical History  Diagnosis Date  . 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 Tobacco abuse   . Endometriosis   . HTN (hypertension)     Hx PIH w/ preg; now borderline - no meds  . Anemia     history  . Anxiety   . Headache     OTC meds prn  . Depression      History - no meds  . Cancer   . Neuromuscular disorder     Past Surgical History  Procedure Date  . Exploratory laparotomy 11/11/07    LOA, Right ovarian cystectomy  . Diagnostic laparoscopy 05/16/10    Convert Exp Laparotomy, LOA, Fulguration of endometriosis, Right oophorectomy  . Svd 2010    x 1  . Laparoscopic hysterectomy 05/19/2011    Procedure: HYSTERECTOMY TOTAL LAPAROSCOPIC;  Surgeon: Tereso Newcomer, MD;  Location: [facility];  Service: Gynecology;  Laterality: N/A;  . Salpingoophorectomy 05/19/2011    Procedure: SALPINGO OOPHERECTOMY;  Surgeon: Tereso Newcomer, MD;  Location: [facility];  Service: Gynecology;  Laterality: Bilateral;  . Cystoscopy 05/19/2011    Procedure: CYSTOSCOPY;  Surgeon: Tereso Newcomer, MD;  Location: [facility];  Service: Gynecology;  Laterality: N/A;  . Abdominal hysterectomy   . Cesarean section     History  Substance Use Topics  . Smoking status: Former Smoker    Types: Cigarettes    Quit date: 09/10/2007  . Smokeless tobacco: Never Used     Comment: down to 1 cig per day  . Alcohol Use: No    Family History  Problem Relation Age of Onset  . Hypertension Father   .  Depression Mother   . Lupus Mother   . Cancer Mother     No Known Allergies  Medication list has been reviewed and updated.  Current Outpatient Prescriptions on File Prior to Visit  Medication Sig Dispense Refill  . cyclobenzaprine (FLEXERIL) 10 MG tablet Take 1 tablet (10 mg total) by mouth 2 (two) times daily as needed for muscle spasms.  20 tablet  0    Review of Systems:  As per HPI- otherwise negative.   Physical Examination: Filed Vitals:   03/29/12 1256  BP: 145/100  Pulse: 111  Temp: 98.2 F (36.8 C)  Resp: 16   Filed Vitals:   03/29/12 1256  Height: 5\' 3"  (1.6 m)  Weight: 164 lb (74.39 kg)   Body mass index is 29.05 kg/(m^2). Ideal Body Weight: Weight in (lb) to have BMI = 25: 140.8   GEN: WDWN, NAD, Non-toxic, A & O x 3, overweight HEENT:  Atraumatic, Normocephalic. Neck supple. No masses, No LAD. PEERL, EOMI Ears and Nose: No external deformity. CV: RRR, No M/G/R. No JVD. No thrill. No extra heart sounds. Mild tachycardia  PULM: CTA B, no wheezes, crackles, rhonchi. No retractions. No resp. distress. No accessory muscle use. Back: she has tenderness over her lower back muscles, normal leg strength and sensation, negative SLR bilaterally and normal patellar DTR ABD: S, NT, ND EXTR: No c/c/e NEURO Normal gait.  PSYCH: Normally interactive. Conversant. Not depressed or anxious appearing.  Calm demeanor.    Assessment and Plan: 1. Anxiety  ALPRAZolam (XANAX) 1 MG tablet   [name] is here today requesting several controlled substances.  I will refill her xanax but declined to start her back on narcotics or soma.  We have not been treating her for her back pain, and she does have a surgeon who has seen her for back pain- however it seems they do not think that she should be on chronic narcotics either.  Asked her to call her surgeon for follow- up for her chronic back pain vs trying again to establish with pain management.  She is under a time of stress right now which may be why the desire to use opoids and soma has returned.  Urged her to use caution with xanax.  Her BP and pulse are elevated due to anxiety- asked her to recheck these in the future when she is feeling better.      Abbe Amsterdam, MD

## 2012-07-10 ENCOUNTER — Emergency Department (HOSPITAL_COMMUNITY)
Admission: EM | Admit: 2012-07-10 | Discharge: 2012-07-10 | Disposition: A | Discharge status: Home / Self Care | Payer: BC Managed Care – PPO | Attending: Emergency Medicine | Admitting: Emergency Medicine

## 2012-07-10 ENCOUNTER — Encounter (HOSPITAL_COMMUNITY): Payer: Self-pay | Admitting: *Deleted

## 2012-07-10 ENCOUNTER — Telehealth (HOSPITAL_COMMUNITY): Payer: Self-pay | Admitting: Emergency Medicine

## 2012-07-10 DIAGNOSIS — F411 Generalized anxiety disorder: Secondary | ICD-10-CM | POA: Insufficient documentation

## 2012-07-10 DIAGNOSIS — G8929 Other chronic pain: Secondary | ICD-10-CM | POA: Insufficient documentation

## 2012-07-10 DIAGNOSIS — Z8742 Personal history of other diseases of the female genital tract: Secondary | ICD-10-CM | POA: Insufficient documentation

## 2012-07-10 DIAGNOSIS — K0889 Other specified disorders of teeth and supporting structures: Secondary | ICD-10-CM

## 2012-07-10 DIAGNOSIS — Z862 Personal history of diseases of the blood and blood-forming organs and certain disorders involving the immune mechanism: Secondary | ICD-10-CM | POA: Insufficient documentation

## 2012-07-10 DIAGNOSIS — F3289 Other specified depressive episodes: Secondary | ICD-10-CM | POA: Insufficient documentation

## 2012-07-10 DIAGNOSIS — R509 Fever, unspecified: Secondary | ICD-10-CM | POA: Insufficient documentation

## 2012-07-10 DIAGNOSIS — Z8669 Personal history of other diseases of the nervous system and sense organs: Secondary | ICD-10-CM | POA: Insufficient documentation

## 2012-07-10 DIAGNOSIS — Z79899 Other long term (current) drug therapy: Secondary | ICD-10-CM | POA: Insufficient documentation

## 2012-07-10 DIAGNOSIS — K006 Disturbances in tooth eruption: Secondary | ICD-10-CM | POA: Insufficient documentation

## 2012-07-10 DIAGNOSIS — K089 Disorder of teeth and supporting structures, unspecified: Secondary | ICD-10-CM | POA: Insufficient documentation

## 2012-07-10 DIAGNOSIS — F329 Major depressive disorder, single episode, unspecified: Secondary | ICD-10-CM | POA: Insufficient documentation

## 2012-07-10 DIAGNOSIS — F17200 Nicotine dependence, unspecified, uncomplicated: Secondary | ICD-10-CM | POA: Insufficient documentation

## 2012-07-10 MED ORDER — OXYCODONE-ACETAMINOPHEN 5-325 MG PO TABS
2.0000 | ORAL_TABLET | Freq: Once | ORAL | Status: AC
  Administered 2012-07-10: 2 via ORAL
  Filled 2012-07-10: qty 2

## 2012-07-10 MED ORDER — PENICILLIN V POTASSIUM 500 MG PO TABS: 500.0000 mg | ORAL_TABLET | Freq: Four times a day (QID) | ORAL | Status: DC

## 2012-07-10 MED ORDER — OXYCODONE-ACETAMINOPHEN 5-325 MG PO TABS: 1.0000 | ORAL_TABLET | Freq: Four times a day (QID) | ORAL | Status: DC | PRN

## 2012-07-10 MED ORDER — BUPIVACAINE-EPINEPHRINE PF 0.5-1:200000 % IJ SOLN: 10.0000 mL | Freq: Once | INTRAMUSCULAR | Status: DC

## 2012-07-10 MED ORDER — BUPIVACAINE-EPINEPHRINE PF 0.5-1:200000 % IJ SOLN
1.8000 mL | Freq: Once | INTRAMUSCULAR | Status: DC
  Filled 2012-07-10: qty 1.8

## 2012-07-10 NOTE — ED Provider Notes (Signed)
  Medical screening examination/treatment/procedure(s) were performed by non-physician practitioner and as supervising physician I was immediately available for consultation/collaboration.    Nechama Escutia, MD 07/10/12 1547 

## 2012-07-10 NOTE — ED Notes (Signed)
Pt states she began having upper left jaw pain 2 days ago.  Some mild swelling to left upper gums is noted, but no redness.  Pt's left face is swollen.  Pt also c/o N/V and low grade fever at home.

## 2012-07-10 NOTE — ED Provider Notes (Signed)
History     CSN: [id number]  Arrival date & time 07/10/12  1149   First MD Initiated Contact with Patient 07/10/12 1151      Chief Complaint  Patient presents with  . Dental Pain    (Consider location/radiation/quality/duration/timing/severity/associated sxs/prior treatment) HPI Comments: This is a 27 year old female, who presents emergency department with chief complaint of dental pain. Patient states that she has had this pain for the past several months, she was recently evaluated by an urgent care, who told her that she had to have her upper right tooth removed. She was given hydrocodone which helped a little bit. She states that today she is having worsening pain, along with swelling of her cheek. She endorses subjective fever, but is afebrile here. He states that her pain is 10 out of 10. The pain radiates to the cheek and jaw. She has a followup appointment with an oral surgeon on Thursday.  The history is provided by the patient. No language interpreter was used.    Past Medical History  Diagnosis Date  . 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 Tobacco abuse   . Endometriosis   . HTN (hypertension)     Hx PIH w/ preg; now borderline - no meds  . Anemia     history  . Anxiety   . Headache     OTC meds prn  . Depression     History - no meds  . Neuromuscular disorder     Past Surgical History  Procedure Laterality Date  . Exploratory laparotomy  11/11/07    LOA, Right ovarian cystectomy  . Diagnostic laparoscopy  05/16/10    Convert Exp Laparotomy, LOA, Fulguration of endometriosis, Right oophorectomy  . Svd  2010    x 1  . Laparoscopic hysterectomy  05/19/2011    Procedure: HYSTERECTOMY TOTAL LAPAROSCOPIC;  Surgeon: Tereso Newcomer, MD;  Location: [facility];  Service: Gynecology;  Laterality: N/A;  . Salpingoophorectomy  05/19/2011    Procedure: SALPINGO OOPHERECTOMY;  Surgeon: Tereso Newcomer, MD;  Location: [facility];  Service: Gynecology;  Laterality: Bilateral;  .  Cystoscopy  05/19/2011    Procedure: CYSTOSCOPY;  Surgeon: Tereso Newcomer, MD;  Location: [facility];  Service: Gynecology;  Laterality: N/A;  . Abdominal hysterectomy    . Cesarean section      Family History  Problem Relation Age of Onset  . Hypertension Father   . Depression Mother   . Lupus Mother   . Cancer Mother     History  Substance Use Topics  . Smoking status: Current Every Day Smoker    Types: Cigarettes    Last Attempt to Quit: 09/10/2007  . Smokeless tobacco: Never Used     Comment: down to 1 cig per day  . Alcohol Use: No    OB History   Grav Para Term Preterm Abortions TAB SAB Ect Mult Living   1 1 1       1       Review of Systems  All other systems reviewed and are negative.    Allergies  Review of patient's allergies indicates no known allergies.  Home Medications   Current Outpatient Rx  Name  Route  Sig  Dispense  Refill  . FLUoxetine (PROZAC) 40 MG capsule   Oral   Take 40 mg by mouth daily.         . hydrochlorothiazide (HYDRODIURIL) 25 MG tablet   Oral   Take 25 mg by mouth daily.         Marland Kitchen  ibuprofen (ADVIL,MOTRIN) 200 MG tablet   Oral   Take 400 mg by mouth every 6 (six) hours as needed for pain.         . Multiple Vitamin (MULTIVITAMIN WITH MINERALS) TABS   Oral   Take 1 tablet by mouth daily.         . naproxen sodium (ANAPROX) 220 MG tablet   Oral   Take 220 mg by mouth 2 (two) times daily as needed (for pain).           BP 155/95  Pulse 120  Temp(Src) 98.3 F (36.8 C) (Oral)  Resp 18  SpO2 99%  LMP 04/15/2011  Physical Exam  Nursing note and vitals reviewed. Constitutional: She is oriented to person, place, and time. She appears well-developed and well-nourished.  HENT:  Head: Normocephalic and atraumatic.  Upper right wisdom tooth angulated, and impacted, and no signs of gingival abscesses, no signs of peritonsillar or tonsillar abscesses uvula is midline, airway is intact  Eyes: Conjunctivae and EOM are  normal.  Neck: Normal range of motion.  Cardiovascular: Normal rate.   Pulmonary/Chest: Effort normal.  Abdominal: She exhibits no distension.  Musculoskeletal: Normal range of motion.  Neurological: She is alert and oriented to person, place, and time.  Skin: Skin is dry.  Psychiatric: She has a normal mood and affect. Her behavior is normal. Judgment and thought content normal.    ED Course  Dental Date/Time: 07/10/2012 3:38 PM Performed by: Roxy Horseman Authorized by: Roxy Horseman Consent: Verbal consent obtained. Risks and benefits: risks, benefits and alternatives were discussed Consent given by: patient Patient understanding: patient states understanding of the procedure being performed Patient identity confirmed: verbally with patient Local anesthesia used: yes Local anesthetic: bupivacaine 0.5% with epinephrine Patient sedated: no Patient tolerance: Patient tolerated the procedure well with no immediate complications.   (including critical care time)  Labs Reviewed - No data to display No results found.   1. Pain, dental       MDM  27 year old female with dental pain. Treated with alveolar block. Will give pain medicine and antibiotics, with dental referral. Patient understands and agrees with the plan. She is stable and ready for discharge.        Roxy Horseman, PA-C 07/10/12 1539

## 2012-07-10 NOTE — ED Notes (Addendum)
Pt states 2 days ago she developed upper right jaw pain and went to a dentist yesterday.  Xrays were done and patient was told she needed to have her wisdom teeth out because they are pushing on her jaw.  Pt states she has vomited several times in last 2 days and has had a temp of 101 this morning.  Mild swelling is noted to gums in upper left mouth and pt's face appears swollen on left side.

## 2012-07-10 NOTE — ED Notes (Signed)
Pharmacy calling to check if EDP was aware of previous Rxs for controlled substances.

## 2012-07-23 ENCOUNTER — Encounter (HOSPITAL_COMMUNITY): Payer: Self-pay | Admitting: Emergency Medicine

## 2012-07-23 ENCOUNTER — Emergency Department (HOSPITAL_COMMUNITY)
Admission: EM | Admit: 2012-07-23 | Discharge: 2012-07-23 | Disposition: A | Discharge status: Home / Self Care | Payer: BC Managed Care – PPO | Attending: Emergency Medicine | Admitting: Emergency Medicine

## 2012-07-23 DIAGNOSIS — F17200 Nicotine dependence, unspecified, uncomplicated: Secondary | ICD-10-CM | POA: Insufficient documentation

## 2012-07-23 DIAGNOSIS — K1370 Unspecified lesions of oral mucosa: Secondary | ICD-10-CM | POA: Insufficient documentation

## 2012-07-23 DIAGNOSIS — Z79899 Other long term (current) drug therapy: Secondary | ICD-10-CM | POA: Insufficient documentation

## 2012-07-23 DIAGNOSIS — F419 Anxiety disorder, unspecified: Secondary | ICD-10-CM

## 2012-07-23 DIAGNOSIS — K089 Disorder of teeth and supporting structures, unspecified: Secondary | ICD-10-CM | POA: Insufficient documentation

## 2012-07-23 DIAGNOSIS — R220 Localized swelling, mass and lump, head: Secondary | ICD-10-CM | POA: Insufficient documentation

## 2012-07-23 DIAGNOSIS — F410 Panic disorder [episodic paroxysmal anxiety] without agoraphobia: Secondary | ICD-10-CM | POA: Insufficient documentation

## 2012-07-23 DIAGNOSIS — K0889 Other specified disorders of teeth and supporting structures: Secondary | ICD-10-CM

## 2012-07-23 DIAGNOSIS — Z8669 Personal history of other diseases of the nervous system and sense organs: Secondary | ICD-10-CM | POA: Insufficient documentation

## 2012-07-23 DIAGNOSIS — R221 Localized swelling, mass and lump, neck: Secondary | ICD-10-CM | POA: Insufficient documentation

## 2012-07-23 DIAGNOSIS — F329 Major depressive disorder, single episode, unspecified: Secondary | ICD-10-CM | POA: Insufficient documentation

## 2012-07-23 DIAGNOSIS — Z8742 Personal history of other diseases of the female genital tract: Secondary | ICD-10-CM | POA: Insufficient documentation

## 2012-07-23 DIAGNOSIS — I10 Essential (primary) hypertension: Secondary | ICD-10-CM | POA: Insufficient documentation

## 2012-07-23 DIAGNOSIS — F3289 Other specified depressive episodes: Secondary | ICD-10-CM | POA: Insufficient documentation

## 2012-07-23 MED ORDER — HYDROCODONE-ACETAMINOPHEN 5-325 MG PO TABS: 2.0000 | ORAL_TABLET | ORAL | Status: DC | PRN

## 2012-07-23 MED ORDER — ALPRAZOLAM 1 MG PO TABS: 1.0000 mg | ORAL_TABLET | Freq: Every evening | ORAL | Status: DC | PRN

## 2012-07-23 MED ORDER — CLINDAMYCIN HCL 150 MG PO CAPS: 150.0000 mg | ORAL_CAPSULE | Freq: Four times a day (QID) | ORAL | Status: DC

## 2012-07-23 NOTE — ED Provider Notes (Signed)
Medical screening examination/treatment/procedure(s) were performed by non-physician practitioner and as supervising physician I was immediately available for consultation/collaboration.  Ethelda Chick, MD 07/23/12 [phone]

## 2012-07-23 NOTE — ED Provider Notes (Signed)
History     CSN: [id number]  Arrival date & time 07/23/12  1207   First MD Initiated Contact with Patient 07/23/12 1324      Chief Complaint  Patient presents with  . Dental Pain    (Consider location/radiation/quality/duration/timing/severity/associated sxs/prior treatment) Patient is a 27 y.o. female presenting with tooth pain. The history is provided by the patient.  Dental PainThe primary symptoms include mouth pain. The symptoms began 3 to 5 days ago. The symptoms are worsening. The symptoms occur constantly.  Additional symptoms include: dental sensitivity to temperature and gum swelling.  Pt reports oral surgeon removed tooth 3/25.   Pt reports increased pain and swelling at site this week.   Pt also complains of chronic anxiety and panic attacks.  Pt requesting xanax.   Past Medical History  Diagnosis Date  . 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 Tobacco abuse   . Endometriosis   . HTN (hypertension)     Hx PIH w/ preg; now borderline - no meds  . Anemia     history  . Anxiety   . Headache     OTC meds prn  . Depression     History - no meds  . Neuromuscular disorder     Past Surgical History  Procedure Laterality Date  . Exploratory laparotomy  11/11/07    LOA, Right ovarian cystectomy  . Diagnostic laparoscopy  05/16/10    Convert Exp Laparotomy, LOA, Fulguration of endometriosis, Right oophorectomy  . Svd  2010    x 1  . Laparoscopic hysterectomy  05/19/2011    Procedure: HYSTERECTOMY TOTAL LAPAROSCOPIC;  Surgeon: Tereso Newcomer, MD;  Location: [facility];  Service: Gynecology;  Laterality: N/A;  . Salpingoophorectomy  05/19/2011    Procedure: SALPINGO OOPHERECTOMY;  Surgeon: Tereso Newcomer, MD;  Location: [facility];  Service: Gynecology;  Laterality: Bilateral;  . Cystoscopy  05/19/2011    Procedure: CYSTOSCOPY;  Surgeon: Tereso Newcomer, MD;  Location: [facility];  Service: Gynecology;  Laterality: N/A;  . Abdominal hysterectomy    . Cesarean section      Family  History  Problem Relation Age of Onset  . Hypertension Father   . Depression Mother   . Lupus Mother   . Cancer Mother     History  Substance Use Topics  . Smoking status: Current Every Day Smoker    Types: Cigarettes    Last Attempt to Quit: 09/10/2007  . Smokeless tobacco: Never Used     Comment: down to 1 cig per day  . Alcohol Use: No    OB History   Grav Para Term Preterm Abortions TAB SAB Ect Mult Living   1 1 1       1       Review of Systems  All other systems reviewed and are negative.    Allergies  Review of patient's allergies indicates no known allergies.  Home Medications   Current Outpatient Rx  Name  Route  Sig  Dispense  Refill  . FLUoxetine (PROZAC) 40 MG capsule   Oral   Take 40 mg by mouth daily.         . hydrochlorothiazide (HYDRODIURIL) 25 MG tablet   Oral   Take 25 mg by mouth daily.         . penicillin v potassium (VEETID) 500 MG tablet   Oral   Take 1 tablet (500 mg total) by mouth 4 (four) times daily.   40 tablet  0   . ALPRAZolam (XANAX) 1 MG tablet   Oral   Take 1 tablet (1 mg total) by mouth at bedtime as needed for sleep.   10 tablet   0   . clindamycin (CLEOCIN) 150 MG capsule   Oral   Take 1 capsule (150 mg total) by mouth every 6 (six) hours.   28 capsule   0   . HYDROcodone-acetaminophen (NORCO/VICODIN) 5-325 MG per tablet   Oral   Take 2 tablets by mouth every 4 (four) hours as needed for pain.   10 tablet   0     BP 136/86  Pulse 100  Temp(Src) 98.5 F (36.9 C)  Resp 20  SpO2 99%  LMP 04/15/2011  Physical Exam  Nursing note and vitals reviewed. Constitutional: She is oriented to person, place, and time. She appears well-developed and well-nourished.  HENT:  Head: Normocephalic and atraumatic.  Possible swelling left lower gumline  Eyes: Pupils are equal, round, and reactive to light.  Neck: Normal range of motion.  Cardiovascular: Normal rate.   Pulmonary/Chest: Effort normal.   Neurological: She is alert and oriented to person, place, and time. She has normal reflexes.  Skin: Skin is warm.  Psychiatric: She has a normal mood and affect.    ED Course  Procedures (including critical care time)  Labs Reviewed - No data to display No results found.   1. Toothache   2. Anxiety       MDM  Pt advised I will give her 10 xanax.  [facility].          Lonia Skinner [place], PA-C 07/23/12 1335

## 2012-07-23 NOTE — ED Notes (Signed)
Patient had all of her wisdom teeth removed a week ago.  Patient c/o lower left dental pain that worsened this morning.  Patient denies fevers.

## 2012-08-11 IMAGING — US US TRANSVAGINAL NON-OB
1 series · 13 of 25 positions shown · non-contrast
Comparison: 12/30/2008

CLINICAL DATA: Pelvic pain.  History of ovarian cysts.  Prior
surgical removal of ovarian cyst.  LMP 12/11/2009



[Series 1: us transvaginal non-ob · 0.27mm/px · 43 acquisitions, 13 frames shown]
[im 1/43]
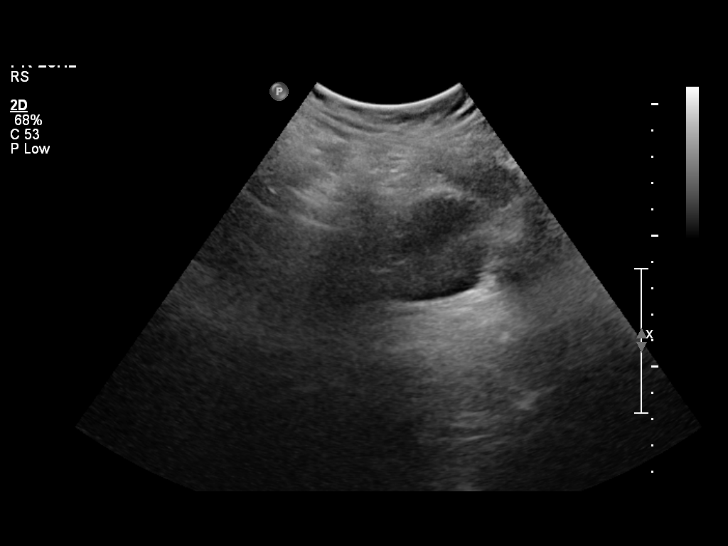
[im 4/43]
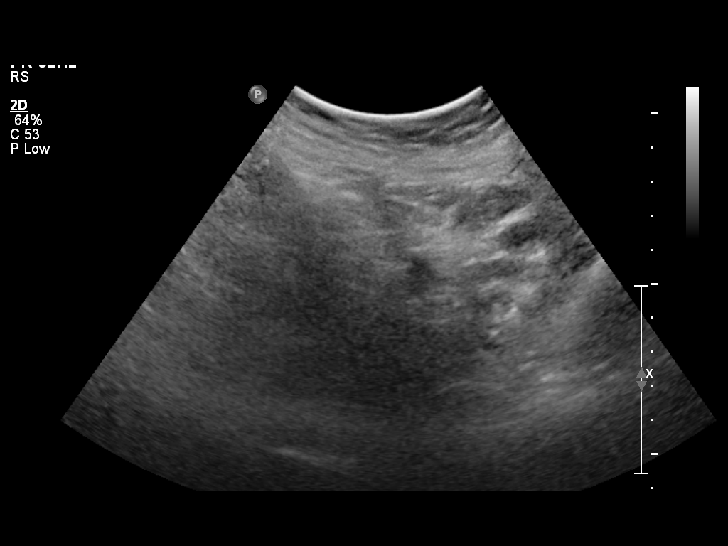
[im 8/43]
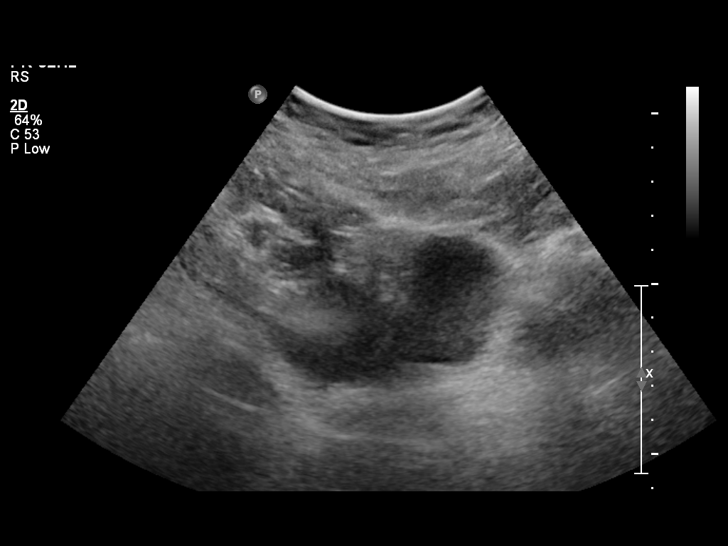
[im 11/43]
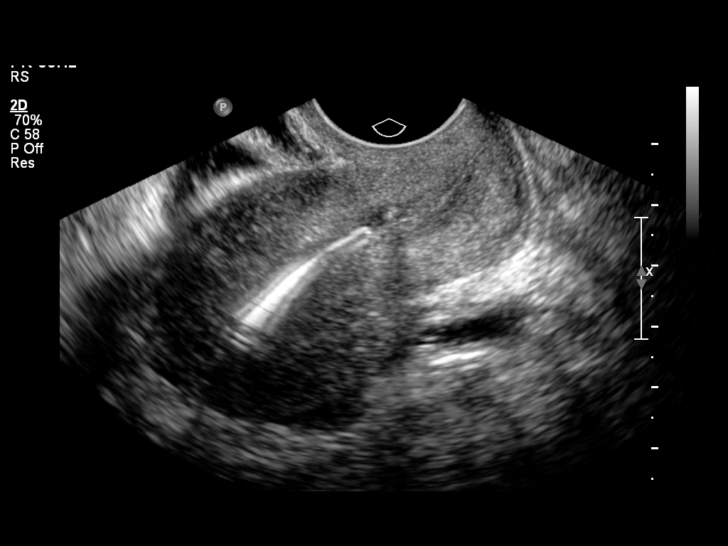
[im 15/43]
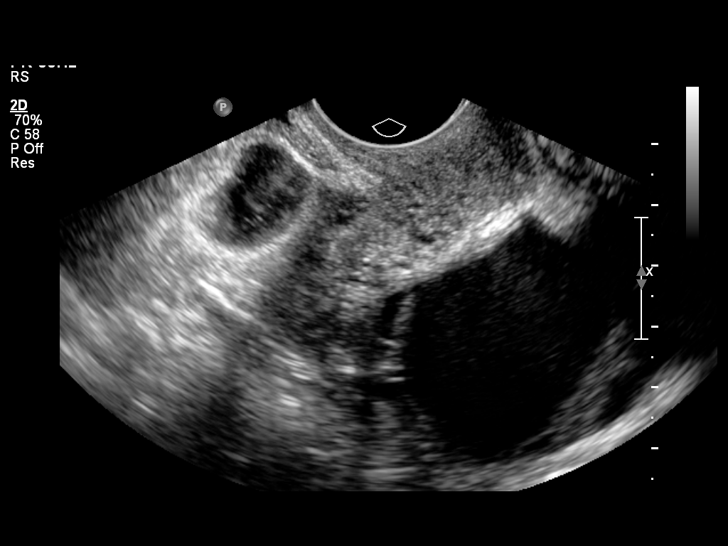
[im 18/43]
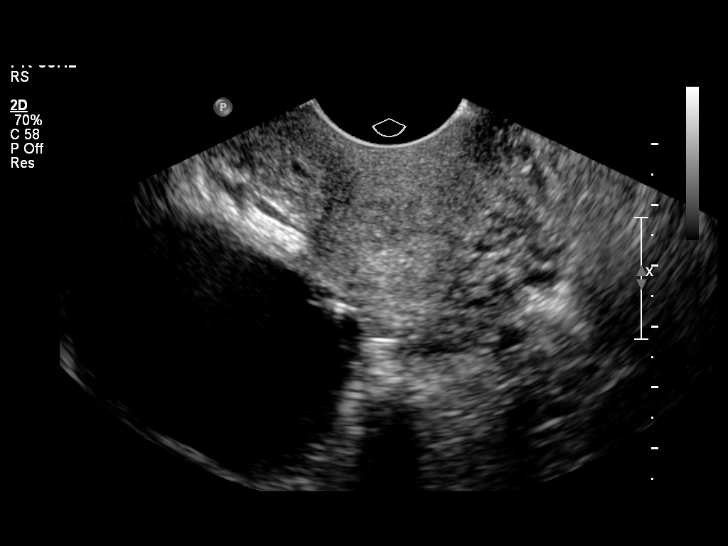
[im 22/43]
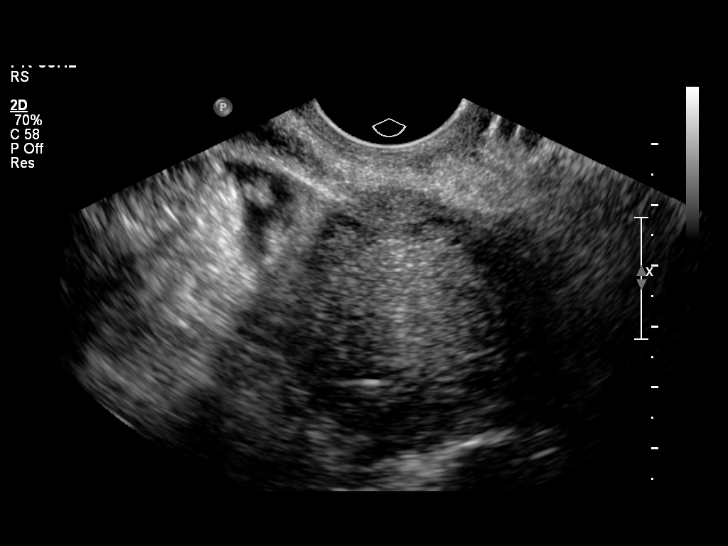
[im 25/43]
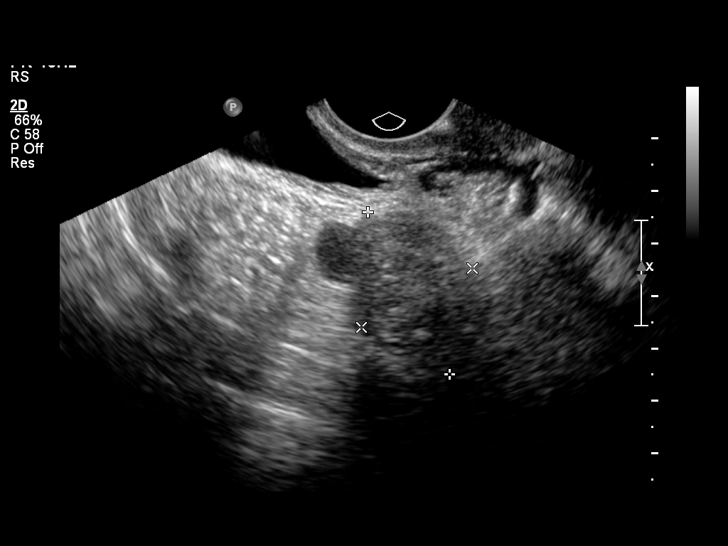
[im 29/43]
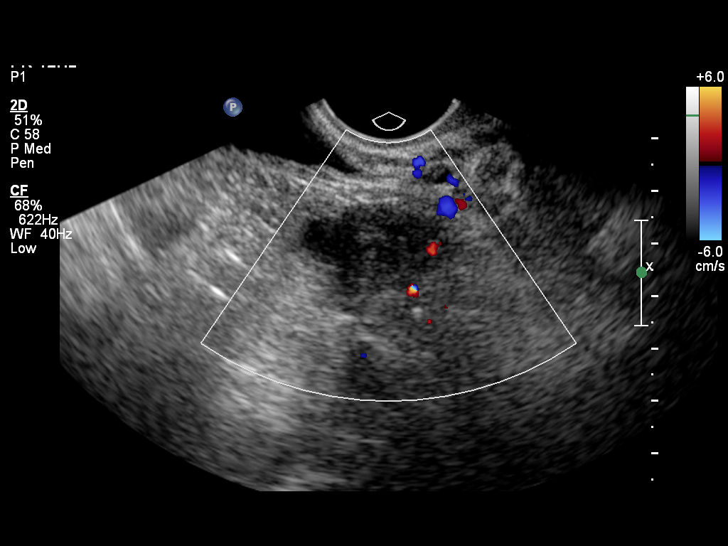
[im 32/43]
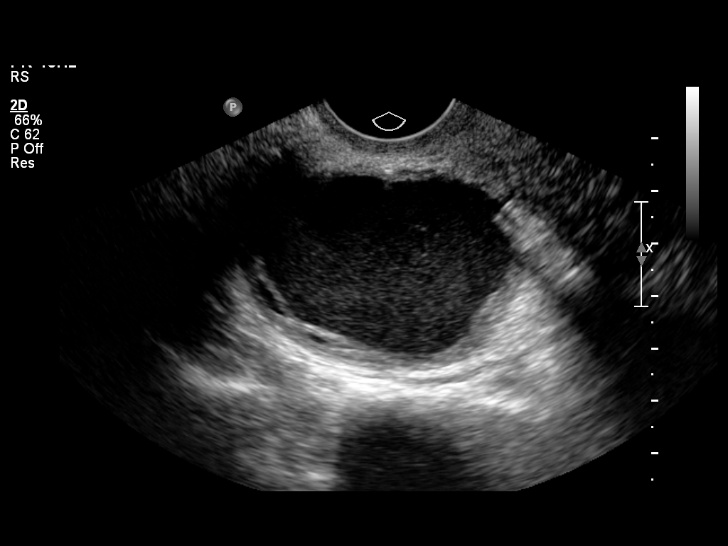
[im 36/43]
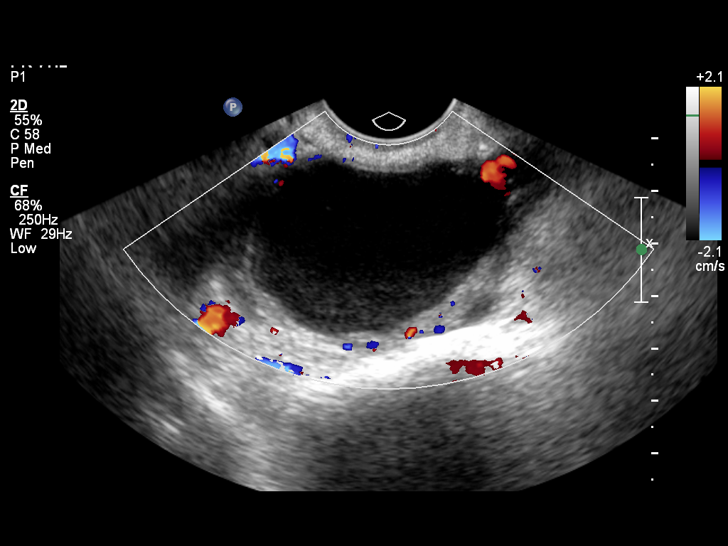
[im 39/43]
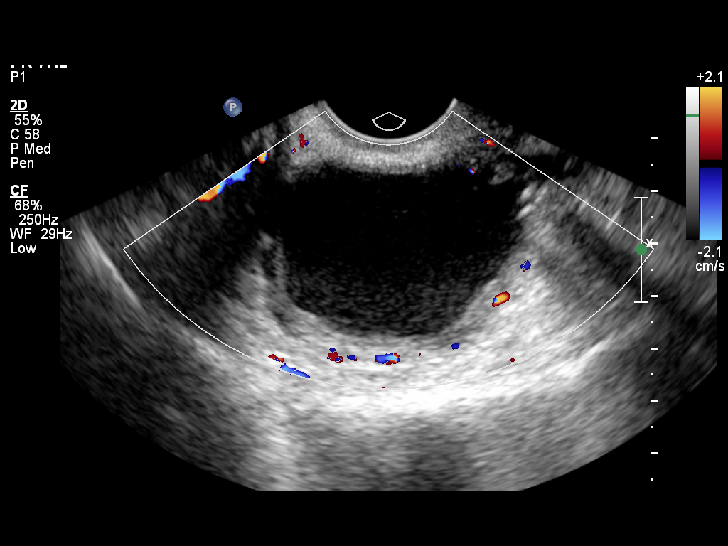
[im 43/43]
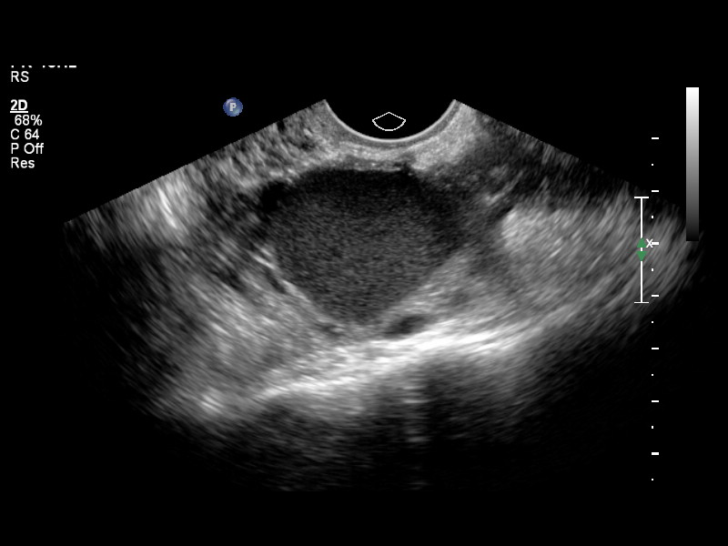

[13 of 25 positions shown; findings below may reference images not displayed]

FINDINGS: Uterus the uterus has a sagittal length of 6.6 cm, an AP depth of
3.9 cm and a transverse width of 4.4 cm.  A homogeneous uterine
myometrium is seen.

Endometrium an IUD is in place within the endometrial canal and
appears to be appropriate in position on 2-D imaging.  An accurate
plane of imaging could not be obtained with 3-D coronal
reconstructions for further assessment of IUD placement.

Right Ovary measures 5.5 x 3.9 x 5.5 cm and contains a unilocular
complex cyst measuring 4.7 x 4.6 x 3.3 cm which contains diffuse
low level echoes.  The appearance is most suggestive of an
endometrioma although a hemorrhagic cyst could have a similar
appearance.  The prior exam did show a smaller complex cyst but the
appearance today is different than that seen previously.

Left Ovary measures 2.4 x 3.5 x 2.4 cm and has a normal appearance

Other Findings:  No pelvic fluid or separate adnexal masses are
seen.
IMPRESSION: Normal uterine myometrium and the left ovary.

Appropriate IUD positioning within the endometrial canal suggested
on 2-D imaging.

Complex right ovarian cyst with an appearance most suggestive of an
endometrioma although a hemorrhagic cyst would be a secondary
consideration given an interval change in appearance since 1979.
Follow-up would be recommended in the immediate postsecretory phase
of the cycle following the next complete cycle to reassess.  An
endometrioma would be expected to remain unchanged while a
hemorrhagic cyst should shows some interval evolution/resolution
over that period of time.

## 2012-08-24 ENCOUNTER — Ambulatory Visit (INDEPENDENT_AMBULATORY_CARE_PROVIDER_SITE_OTHER): Payer: BC Managed Care – PPO | Admitting: Family Medicine

## 2012-08-24 VITALS — BP 156/102 | HR 107 | Temp 99.7°F | Resp 18 | Ht 65.0 in | Wt 176.0 lb

## 2012-08-24 DIAGNOSIS — G479 Sleep disorder, unspecified: Secondary | ICD-10-CM

## 2012-08-24 DIAGNOSIS — F411 Generalized anxiety disorder: Secondary | ICD-10-CM

## 2012-08-24 MED ORDER — ALPRAZOLAM 1 MG PO TABS: ORAL_TABLET | ORAL | Status: DC

## 2012-08-24 MED ORDER — HYDROXYZINE PAMOATE 25 MG PO CAPS: ORAL_CAPSULE | ORAL | Status: DC

## 2012-08-24 MED ORDER — BUPROPION HCL 75 MG PO TABS: ORAL_TABLET | ORAL | Status: DC

## 2012-08-24 NOTE — Progress Notes (Signed)
Rachel Mcdaniel is a 27 y.o. female who presents to [facility] today for anxiety. Patient was previously under the care of the PA in [facility]. She was being prescribed Xanax. Most recently she was given 90 Xanax refills on April 10. She recently changed her primary care provider to general medical clinic next door to the urgent care.  She was seen here last week with worsening anxiety. Her Xanax was not refilled and she was advised to followup with a psychiatrist, however her appointment date is June 7.  She has run out of Xanax 2 days ago and is having worsening anxiety. Additionally her other grandfather died 3 days ago.  All this is culminating in worsening anxiety. Patient describes fear of leaving the house for fear of panic attacks. She denies any tremor clonus or seizure.  She has an extensive anxiety history has failed treatment with multiple medications including BuSpar on an SSRIs.  Xanax is the only medication that works. Additionally patient's anxiety is complicated by chronic pain requiring opiates.     PMH: Reviewed as noted above History  Substance Use Topics  . Smoking status: Current Every Day Smoker    Types: Cigarettes    Last Attempt to Quit: 09/10/2007  . Smokeless tobacco: Never Used     Comment: down to 1 cig per day  . Alcohol Use: No   ROS as above  Medications reviewed. Current Outpatient Prescriptions  Medication Sig Dispense Refill  . ALPRAZolam (XANAX) 1 MG tablet Take 1 tablet (1 mg total) by mouth at bedtime as needed for sleep.  10 tablet  0  . FLUoxetine (PROZAC) 40 MG capsule Take 40 mg by mouth daily.      . hydrochlorothiazide (HYDRODIURIL) 25 MG tablet Take 25 mg by mouth daily.      Marland Kitchen HYDROcodone-acetaminophen (NORCO/VICODIN) 5-325 MG per tablet Take 2 tablets by mouth every 4 (four) hours as needed for pain.  10 tablet  0  . clindamycin (CLEOCIN) 150 MG capsule Take 1 capsule (150 mg total) by mouth every 6 (six) hours.  28 capsule  0  .  penicillin v potassium (VEETID) 500 MG tablet Take 1 tablet (500 mg total) by mouth 4 (four) times daily.  40 tablet  0   No current facility-administered medications for this visit.    Exam:  BP 156/102  Pulse 107  Temp(Src) 99.7 F (37.6 C) (Oral)  Resp 18  Ht 5\' 5"  (1.651 m)  Wt 176 lb (79.833 kg)  BMI 29.29 kg/m2  SpO2 99%  LMP 04/15/2011 Gen: Well NAD, anxious-appearing HEENT: EOMI,  MMM Lungs: CTABL Nl WOB Heart: RRR no MRG Abd: NABS, NT, ND Exts: Non edematous BL  LE, warm and well perfused.  Neuro: No tremor or clonus  No results found for this or any previous visit (from the past 72 hour(s)).  Assessment and Plan: 27 y.o. female with anxiety disorder. I did not feel that refill of Xanax was warranted at this time. The patient asked to see another provider and Dr. Alwyn Ren agreed to see the patient.   See his note for further details.

## 2012-08-24 NOTE — Progress Notes (Signed)
Dr. Denyse Amass asked that I also see the patient. He has had prior encounters with her, and felt to be better for some asked to make decisions.  History as below. The patient has a long history of anxiety and has been on high doses of anxiolytics for many years. She has seen psychiatrists in the past, and has been tried on numerous medications. Her grandfather died 3 days ago and the funeral is in 2 days. She is out of her Xanax.  Had a long discussion with her about what she's been on. She is 27 years old and has been on a lot of medicine.  Objective: Exam not done  Assessment: Anxiety disorder Panic Benzodiazepine dependence  Plan: Advised to keep her appointment with her psychiatrist. Decided to try a little bit of Wellbutrin with her Prozac. Prozac doesn't help her much she feels. We will try Wellbutrin 75 phased into Prozac 40 run than her usual Prozac 80 dose. Cautioned her to get help if she has adverse effects.  Gave her a few benzodiazepines (Xanax) to take while she is getting through the stress of the purulent she has a panic attack. Urged her to work harder just getting off medications.  30 minute discussion  I will not continue prescribing Xanax for her .

## 2012-08-24 NOTE — Patient Instructions (Signed)
Decrease Prozac to 40 mg daily.  Then after 2 days begin the Wellbutrin 75mg  one daily for 3 days, then two daily.  Discontinue if side effects.    Take hydroxyzine (vistaril) as directed if needed for anxiety or sleep.  Keep your appointment with the psychiatrist.

## 2012-09-04 ENCOUNTER — Encounter (HOSPITAL_COMMUNITY): Payer: Self-pay | Admitting: *Deleted

## 2012-09-04 ENCOUNTER — Emergency Department (HOSPITAL_COMMUNITY)
Admission: EM | Admit: 2012-09-04 | Discharge: 2012-09-05 | Disposition: A | Discharge status: Home / Self Care | Payer: BC Managed Care – PPO | Attending: Emergency Medicine | Admitting: Emergency Medicine

## 2012-09-04 ENCOUNTER — Emergency Department (HOSPITAL_COMMUNITY): Payer: BC Managed Care – PPO

## 2012-09-04 DIAGNOSIS — Z79899 Other long term (current) drug therapy: Secondary | ICD-10-CM | POA: Insufficient documentation

## 2012-09-04 DIAGNOSIS — Z9889 Other specified postprocedural states: Secondary | ICD-10-CM | POA: Insufficient documentation

## 2012-09-04 DIAGNOSIS — I10 Essential (primary) hypertension: Secondary | ICD-10-CM | POA: Insufficient documentation

## 2012-09-04 DIAGNOSIS — Z8669 Personal history of other diseases of the nervous system and sense organs: Secondary | ICD-10-CM | POA: Insufficient documentation

## 2012-09-04 DIAGNOSIS — F329 Major depressive disorder, single episode, unspecified: Secondary | ICD-10-CM | POA: Insufficient documentation

## 2012-09-04 DIAGNOSIS — Z90710 Acquired absence of both cervix and uterus: Secondary | ICD-10-CM | POA: Insufficient documentation

## 2012-09-04 DIAGNOSIS — Z87891 Personal history of nicotine dependence: Secondary | ICD-10-CM | POA: Insufficient documentation

## 2012-09-04 DIAGNOSIS — F3289 Other specified depressive episodes: Secondary | ICD-10-CM | POA: Insufficient documentation

## 2012-09-04 DIAGNOSIS — R109 Unspecified abdominal pain: Secondary | ICD-10-CM | POA: Insufficient documentation

## 2012-09-04 DIAGNOSIS — R197 Diarrhea, unspecified: Secondary | ICD-10-CM | POA: Insufficient documentation

## 2012-09-04 DIAGNOSIS — Z862 Personal history of diseases of the blood and blood-forming organs and certain disorders involving the immune mechanism: Secondary | ICD-10-CM | POA: Insufficient documentation

## 2012-09-04 DIAGNOSIS — F411 Generalized anxiety disorder: Secondary | ICD-10-CM | POA: Insufficient documentation

## 2012-09-04 DIAGNOSIS — Z3202 Encounter for pregnancy test, result negative: Secondary | ICD-10-CM | POA: Insufficient documentation

## 2012-09-04 DIAGNOSIS — G8929 Other chronic pain: Secondary | ICD-10-CM | POA: Insufficient documentation

## 2012-09-04 DIAGNOSIS — Z8742 Personal history of other diseases of the female genital tract: Secondary | ICD-10-CM | POA: Insufficient documentation

## 2012-09-04 DIAGNOSIS — Z9089 Acquired absence of other organs: Secondary | ICD-10-CM | POA: Insufficient documentation

## 2012-09-04 LAB — URINALYSIS, ROUTINE W REFLEX MICROSCOPIC
Bilirubin Urine: NEGATIVE
Glucose, UA: NEGATIVE mg/dL
Hgb urine dipstick: NEGATIVE
Ketones, ur: NEGATIVE mg/dL
pH: 5 (ref 5.0–8.0)

## 2012-09-04 LAB — CBC WITH DIFFERENTIAL/PLATELET
Eosinophils Absolute: 0.1 10*3/uL (ref 0.0–0.7)
Eosinophils Relative: 1 % (ref 0–5)
Hemoglobin: 12.5 g/dL (ref 12.0–15.0)
Lymphocytes Relative: 31 % (ref 12–46)
Lymphs Abs: 3.1 10*3/uL (ref 0.7–4.0)
MCH: 28.9 pg (ref 26.0–34.0)
MCV: 88.2 fL (ref 78.0–100.0)
Monocytes Relative: 5 % (ref 3–12)
RBC: 4.32 MIL/uL (ref 3.87–5.11)

## 2012-09-04 LAB — BASIC METABOLIC PANEL
BUN: 9 mg/dL (ref 6–23)
CO2: 24 mEq/L (ref 19–32)
Calcium: 10.1 mg/dL (ref 8.4–10.5)
GFR calc non Af Amer: >90 mL/min (ref >90)
Glucose, Bld: 80 mg/dL (ref 70–99)
Potassium: 3.7 mEq/L (ref 3.5–5.1)

## 2012-09-04 MED ORDER — SODIUM CHLORIDE 0.9 % IV SOLN
Freq: Once | INTRAVENOUS | Status: AC
  Administered 2012-09-04: 500 mL via INTRAVENOUS

## 2012-09-04 MED ORDER — OXYCODONE-ACETAMINOPHEN 5-325 MG PO TABS
1.0000 | ORAL_TABLET | Freq: Once | ORAL | Status: AC
  Administered 2012-09-04: 1 via ORAL
  Filled 2012-09-04: qty 1

## 2012-09-04 MED ORDER — IOHEXOL 300 MG/ML  SOLN
50.0000 mL | Freq: Once | INTRAMUSCULAR | Status: AC | PRN
  Administered 2012-09-04: 50 mL via ORAL

## 2012-09-04 MED ORDER — MORPHINE SULFATE 4 MG/ML IJ SOLN
4.0000 mg | Freq: Once | INTRAMUSCULAR | Status: AC
  Administered 2012-09-04: 4 mg via INTRAVENOUS
  Filled 2012-09-04: qty 1

## 2012-09-04 MED ORDER — IOHEXOL 300 MG/ML  SOLN
100.0000 mL | Freq: Once | INTRAMUSCULAR | Status: AC | PRN
  Administered 2012-09-04: 100 mL via INTRAVENOUS

## 2012-09-04 MED ORDER — ONDANSETRON 8 MG PO TBDP
8.0000 mg | ORAL_TABLET | Freq: Once | ORAL | Status: AC
  Administered 2012-09-04: 8 mg via ORAL
  Filled 2012-09-04: qty 1

## 2012-09-04 NOTE — ED Notes (Signed)
Pt states she was at work and last thing she remembers was her co workers standing around her because she fell to floor with severe abdominal pain

## 2012-09-04 NOTE — ED Provider Notes (Addendum)
History     CSN: [id number]  Arrival date & time 09/04/12  1820   First MD Initiated Contact with Patient 09/04/12 1921      Chief Complaint  Patient presents with  . Abdominal Pain  . Diarrhea    (Consider location/radiation/quality/duration/timing/severity/associated sxs/prior treatment) Patient is a 27 y.o. female presenting with abdominal pain and diarrhea.  Abdominal Pain Pain location:  Suprapubic Pain quality: cramping   Associated symptoms: diarrhea   Associated symptoms: no chest pain, no dysuria, no fever, no hematuria, no nausea, no shortness of breath, no vaginal discharge and no vomiting   Associated symptoms comment:  She reports that she had a sudden intense lower abdominal pain while at work that caused her to collapse to the floor. No syncope. She has a history of chronic pelvic pain thought secondary to endometriosis. No N, V. Diarrhea Associated symptoms: abdominal pain   Associated symptoms: no fever and no vomiting   Abdominal Pain Associated symptoms include abdominal pain. Pertinent negatives include no chest pain, fever, nausea or vomiting. Associated symptoms comments: She reports that she had a sudden intense lower abdominal pain while at work that caused her to collapse to the floor. No syncope. She has a history of chronic pelvic pain thought secondary to endometriosis. No N, V..    Past Medical History  Diagnosis Date  . 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 Tobacco abuse   . Endometriosis   . HTN (hypertension)     Hx PIH w/ preg; now borderline - no meds  . Anemia     history  . Anxiety   . Headache     OTC meds prn  . Depression     History - no meds  . Neuromuscular disorder     Past Surgical History  Procedure Laterality Date  . Exploratory laparotomy  11/11/07    LOA, Right ovarian cystectomy  . Diagnostic laparoscopy  05/16/10    Convert Exp Laparotomy, LOA, Fulguration of endometriosis, Right oophorectomy  . Svd  2010    x 1  .  Laparoscopic hysterectomy  05/19/2011    Procedure: HYSTERECTOMY TOTAL LAPAROSCOPIC;  Surgeon: Tereso Newcomer, MD;  Location: [facility];  Service: Gynecology;  Laterality: N/A;  . Salpingoophorectomy  05/19/2011    Procedure: SALPINGO OOPHERECTOMY;  Surgeon: Tereso Newcomer, MD;  Location: [facility];  Service: Gynecology;  Laterality: Bilateral;  . Cystoscopy  05/19/2011    Procedure: CYSTOSCOPY;  Surgeon: Tereso Newcomer, MD;  Location: [facility];  Service: Gynecology;  Laterality: N/A;  . Abdominal hysterectomy    . Cesarean section      Family History  Problem Relation Age of Onset  . Hypertension Father   . Depression Mother   . Lupus Mother   . Cancer Mother     History  Substance Use Topics  . Smoking status: Former Smoker    Quit date: 09/10/2007  . Smokeless tobacco: Never Used     Comment: down to 1 cig per day  . Alcohol Use: No    OB History   Grav Para Term Preterm Abortions TAB SAB Ect Mult Living   1 1 1       1       Review of Systems  Constitutional: Negative for fever and appetite change.  Respiratory: Negative for shortness of breath.   Cardiovascular: Negative for chest pain.  Gastrointestinal: Positive for abdominal pain and diarrhea. Negative for nausea and vomiting.  Genitourinary: Negative for dysuria, hematuria,  flank pain and vaginal discharge.  Musculoskeletal: Negative for back pain.  Skin: Negative for color change and pallor.    Allergies  Review of patient's allergies indicates no known allergies.  Home Medications   Current Outpatient Rx  Name  Route  Sig  Dispense  Refill  . ALPRAZolam (XANAX) 1 MG tablet   Oral   Take 1 mg by mouth 3 (three) times daily as needed for anxiety.         Marland Kitchen buPROPion (WELLBUTRIN) 75 MG tablet   Oral   Take 75 mg by mouth 2 (two) times daily.         Marland Kitchen FLUoxetine (PROZAC) 40 MG capsule   Oral   Take 80 mg by mouth daily.            BP 145/111  Pulse 102  Temp(Src) 98.8 F (37.1 C) (Oral)   Resp 25  SpO2 100%  LMP 04/15/2011  Physical Exam  Constitutional: She appears well-developed and well-nourished. No distress.  Uncomfortable appearing.  HENT:  Head: Normocephalic.  Mouth/Throat: Oropharynx is clear and moist.  Neck: Normal range of motion.  Cardiovascular: Normal rate and regular rhythm.   No murmur heard. Pulmonary/Chest: Effort normal. She has no wheezes. She has no rales.  Abdominal: Soft.  Lower abdominal tenderness bilaterally with worse pain suprapubic region.    ED Course  Procedures (including critical care time)  Labs Reviewed  URINALYSIS, ROUTINE W REFLEX MICROSCOPIC  POCT PREGNANCY, URINE   Results for orders placed during the hospital encounter of 09/04/12  URINALYSIS, ROUTINE W REFLEX MICROSCOPIC      Result Value Range   Color, Urine YELLOW  YELLOW   APPearance CLEAR  CLEAR   Specific Gravity, Urine 1.014  1.005 - 1.030   pH 5.0  5.0 - 8.0   Glucose, UA NEGATIVE  NEGATIVE mg/dL   Hgb urine dipstick NEGATIVE  NEGATIVE   Bilirubin Urine NEGATIVE  NEGATIVE   Ketones, ur NEGATIVE  NEGATIVE mg/dL   Protein, ur NEGATIVE  NEGATIVE mg/dL   Urobilinogen, UA 0.2  0.0 - 1.0 mg/dL   Nitrite NEGATIVE  NEGATIVE   Leukocytes, UA NEGATIVE  NEGATIVE  CBC WITH DIFFERENTIAL      Result Value Range   WBC 10.2  4.0 - 10.5 K/uL   RBC 4.32  3.87 - 5.11 MIL/uL   Hemoglobin 12.5  12.0 - 15.0 g/dL   HCT 40.9  81.1 - 91.4 %   MCV 88.2  78.0 - 100.0 fL   MCH 28.9  26.0 - 34.0 pg   MCHC 32.8  30.0 - 36.0 g/dL   RDW 78.2  95.6 - 21.3 %   Platelets 395  150 - 400 K/uL   Neutrophils Relative % 62  43 - 77 %   Neutro Abs 6.3  1.7 - 7.7 K/uL   Lymphocytes Relative 31  12 - 46 %   Lymphs Abs 3.1  0.7 - 4.0 K/uL   Monocytes Relative 5  3 - 12 %   Monocytes Absolute 0.6  0.1 - 1.0 K/uL   Eosinophils Relative 1  0 - 5 %   Eosinophils Absolute 0.1  0.0 - 0.7 K/uL   Basophils Relative 0  0 - 1 %   Basophils Absolute 0.0  0.0 - 0.1 K/uL  BASIC METABOLIC PANEL       Result Value Range   Sodium 137  135 - 145 mEq/L   Potassium 3.7  3.5 - 5.1 mEq/L  Chloride 102  96 - 112 mEq/L   CO2 24  19 - 32 mEq/L   Glucose, Bld 80  70 - 99 mg/dL   BUN 9  6 - 23 mg/dL   Creatinine, Ser 1.61  0.50 - 1.10 mg/dL   Calcium 09.6  8.4 - 04.5 mg/dL   GFR calc non Af Amer >90  >90 mL/min   GFR calc Af Amer >90  >90 mL/min  POCT PREGNANCY, URINE      Result Value Range   Preg Test, Ur NEGATIVE  NEGATIVE   Ct Abdomen Pelvis W Contrast  09/04/2012   *RADIOLOGY REPORT*  Clinical Data: Abdominal pain, diarrhea.  Syncope.  CT ABDOMEN AND PELVIS WITH CONTRAST  Technique:  Multidetector CT imaging of the abdomen and pelvis was performed following the standard protocol during bolus administration of intravenous contrast.  Contrast: 50mL OMNIPAQUE IOHEXOL 300 MG/ML  SOLN, OMNIPAQUE IOHEXOL 300 MG/ML  SOLN  Comparison: None.  Findings: Pancreas divisum noted.  Hyperdensity in the collecting systems favors early contrast excretion, possibly from a test injection, but significantly reduces sensitivity for nonobstructive calculi.  No hydronephrosis or hydroureter.  No ureteral calculus.  Appendix normal.  Fluid filled terminal ileum and cecum noted.  Uterus absent.  No dilated bowel. Dilution of the contrast column in the distal small bowel observed.  No additional significant abnormalities in the abdomen or pelvis observed.  IMPRESSION:  1. There is some progressive dilution of the distal contrast column which may indicate slow progression or distal small bowel ileus. No definite dilated bowel, or bowel wall thickening.  Appendix normal. 2.  Incidental pancreas divisum. 3.  Low sensitivity for nonobstructive renal calculi due to collecting system excretion of contrast on the initial image set. No ureteral calculus or obstructive renal calculus observed.   Original Report Authenticated By: Gaylyn Rong, M.D.   No results found.   No diagnosis found. 1. Abdominal pain    MDM   Pain improved with IV medications. CT scan negative for acute process. Labs reassuring as well. Patient is stable for discharge and encouraged to see PCP on Monday for recheck. Discussed records review regarding narcotic and benzodiazepine use. No benzo. Rx today, will give limited number narcotic pain relievers.        Arnoldo Hooker, PA-C 09/05/12 0004  Arnoldo Hooker, PA-C 10/12/12 1112

## 2012-09-05 MED ORDER — OXYCODONE-ACETAMINOPHEN 5-325 MG PO TABS: 1.0000 | ORAL_TABLET | ORAL | Status: DC | PRN

## 2012-09-05 MED ORDER — ONDANSETRON HCL 4 MG PO TABS: 4.0000 mg | ORAL_TABLET | Freq: Four times a day (QID) | ORAL | Status: DC

## 2012-09-06 NOTE — ED Provider Notes (Signed)
History/physical exam/procedure(s) were performed by non-physician practitioner and as supervising physician I was immediately available for consultation/collaboration. I have reviewed all notes and am in agreement with care and plan.   Izaan Kingbird S Malijah Lietz, MD 09/06/12 0110 

## 2012-09-12 ENCOUNTER — Encounter (HOSPITAL_COMMUNITY): Payer: Self-pay | Admitting: *Deleted

## 2012-09-12 ENCOUNTER — Encounter (HOSPITAL_COMMUNITY): Payer: Self-pay | Admitting: Emergency Medicine

## 2012-09-12 ENCOUNTER — Inpatient Hospital Stay (HOSPITAL_COMMUNITY)
Admission: AD | Admit: 2012-09-12 | Discharge: 2012-09-19 | DRG: 745 | Disposition: A | Discharge status: Home / Self Care | Payer: BC Managed Care – PPO | Source: Intra-hospital | Attending: Psychiatry | Admitting: Psychiatry

## 2012-09-12 ENCOUNTER — Emergency Department (HOSPITAL_COMMUNITY)
Admission: EM | Admit: 2012-09-12 | Discharge: 2012-09-12 | Disposition: A | Discharge status: Other Acute Inpatient Hospital | Payer: BC Managed Care – PPO | Attending: Emergency Medicine | Admitting: Emergency Medicine

## 2012-09-12 DIAGNOSIS — F321 Major depressive disorder, single episode, moderate: Secondary | ICD-10-CM | POA: Diagnosis not present

## 2012-09-12 DIAGNOSIS — F4001 Agoraphobia with panic disorder: Secondary | ICD-10-CM | POA: Diagnosis present

## 2012-09-12 DIAGNOSIS — F329 Major depressive disorder, single episode, unspecified: Secondary | ICD-10-CM

## 2012-09-12 DIAGNOSIS — I10 Essential (primary) hypertension: Secondary | ICD-10-CM | POA: Diagnosis present

## 2012-09-12 DIAGNOSIS — F1320 Sedative, hypnotic or anxiolytic dependence, uncomplicated: Secondary | ICD-10-CM | POA: Diagnosis present

## 2012-09-12 DIAGNOSIS — Z046 Encounter for general psychiatric examination, requested by authority: Secondary | ICD-10-CM | POA: Diagnosis present

## 2012-09-12 DIAGNOSIS — F1910 Other psychoactive substance abuse, uncomplicated: Secondary | ICD-10-CM

## 2012-09-12 DIAGNOSIS — F1120 Opioid dependence, uncomplicated: Secondary | ICD-10-CM | POA: Diagnosis present

## 2012-09-12 DIAGNOSIS — F1920 Other psychoactive substance dependence, uncomplicated: Secondary | ICD-10-CM | POA: Diagnosis not present

## 2012-09-12 DIAGNOSIS — F3289 Other specified depressive episodes: Secondary | ICD-10-CM

## 2012-09-12 DIAGNOSIS — Z79899 Other long term (current) drug therapy: Secondary | ICD-10-CM

## 2012-09-12 LAB — BASIC METABOLIC PANEL
Calcium: 9.9 mg/dL (ref 8.4–10.5)
GFR calc Af Amer: >90 mL/min (ref >90)
GFR calc non Af Amer: >90 mL/min (ref >90)
Potassium: 3.6 mEq/L (ref 3.5–5.1)
Sodium: 141 mEq/L (ref 135–145)

## 2012-09-12 LAB — CBC WITH DIFFERENTIAL/PLATELET
Basophils Relative: 0 % (ref 0–1)
Eosinophils Absolute: 0.2 10*3/uL (ref 0.0–0.7)
Eosinophils Relative: 2 % (ref 0–5)
MCH: 29 pg (ref 26.0–34.0)
MCHC: 33.1 g/dL (ref 30.0–36.0)
Neutrophils Relative %: 51 % (ref 43–77)
Platelets: 436 10*3/uL — ABNORMAL HIGH (ref 150–400)
RDW: 12.6 % (ref 11.5–15.5)

## 2012-09-12 LAB — ACETAMINOPHEN LEVEL: Acetaminophen (Tylenol), Serum: 31.5 ug/mL — ABNORMAL HIGH (ref 10–30)

## 2012-09-12 LAB — RAPID URINE DRUG SCREEN, HOSP PERFORMED
Barbiturates: NOT DETECTED
Cocaine: NOT DETECTED
Opiates: POSITIVE — AB
Tetrahydrocannabinol: POSITIVE — AB

## 2012-09-12 LAB — URINALYSIS, ROUTINE W REFLEX MICROSCOPIC
Nitrite: NEGATIVE
Protein, ur: NEGATIVE mg/dL
Specific Gravity, Urine: >1.046 — ABNORMAL HIGH (ref 1.005–1.030)
Urobilinogen, UA: 1 mg/dL (ref 0.0–1.0)

## 2012-09-12 LAB — POCT PREGNANCY, URINE: Preg Test, Ur: NEGATIVE

## 2012-09-12 LAB — SALICYLATE LEVEL: Salicylate Lvl: <2 mg/dL — ABNORMAL LOW (ref 2.8–20.0)

## 2012-09-12 MED ORDER — CHLORDIAZEPOXIDE HCL 25 MG PO CAPS
25.0000 mg | ORAL_CAPSULE | Freq: Three times a day (TID) | ORAL | Status: AC
  Administered 2012-09-14 (×2): 25 mg via ORAL
  Filled 2012-09-12 (×2): qty 1

## 2012-09-12 MED ORDER — ONDANSETRON 4 MG PO TBDP
4.0000 mg | ORAL_TABLET | Freq: Four times a day (QID) | ORAL | Status: AC | PRN
  Administered 2012-09-15: 4 mg via ORAL

## 2012-09-12 MED ORDER — ADULT MULTIVITAMIN W/MINERALS CH
1.0000 | ORAL_TABLET | Freq: Every day | ORAL | Status: DC
  Administered 2012-09-12 – 2012-09-13 (×2): 1 via ORAL
  Filled 2012-09-12 (×4): qty 1

## 2012-09-12 MED ORDER — NICOTINE 21 MG/24HR TD PT24
21.0000 mg | MEDICATED_PATCH | Freq: Every day | TRANSDERMAL | Status: DC
  Administered 2012-09-12 – 2012-09-19 (×8): 21 mg via TRANSDERMAL
  Filled 2012-09-12 (×10): qty 1

## 2012-09-12 MED ORDER — ONDANSETRON HCL 4 MG PO TABS: 4.0000 mg | ORAL_TABLET | Freq: Three times a day (TID) | ORAL | Status: DC | PRN

## 2012-09-12 MED ORDER — CHLORDIAZEPOXIDE HCL 25 MG PO CAPS: 25.0000 mg | ORAL_CAPSULE | Freq: Four times a day (QID) | ORAL | Status: AC | PRN

## 2012-09-12 MED ORDER — DICYCLOMINE HCL 20 MG PO TABS: 20.0000 mg | ORAL_TABLET | Freq: Four times a day (QID) | ORAL | Status: AC | PRN

## 2012-09-12 MED ORDER — VITAMIN B-1 100 MG PO TABS
100.0000 mg | ORAL_TABLET | Freq: Every day | ORAL | Status: DC
  Administered 2012-09-13: 100 mg via ORAL
  Filled 2012-09-12 (×3): qty 1

## 2012-09-12 MED ORDER — NAPROXEN 500 MG PO TABS
500.0000 mg | ORAL_TABLET | Freq: Two times a day (BID) | ORAL | Status: DC | PRN
  Administered 2012-09-13: 500 mg via ORAL
  Filled 2012-09-12: qty 1

## 2012-09-12 MED ORDER — CLONIDINE HCL 0.1 MG PO TABS
0.1000 mg | ORAL_TABLET | Freq: Every day | ORAL | Status: AC
  Administered 2012-09-17 – 2012-09-18 (×2): 0.1 mg via ORAL
  Filled 2012-09-12 (×2): qty 1

## 2012-09-12 MED ORDER — CHLORDIAZEPOXIDE HCL 25 MG PO CAPS
25.0000 mg | ORAL_CAPSULE | Freq: Four times a day (QID) | ORAL | Status: AC
  Administered 2012-09-12 – 2012-09-13 (×5): 25 mg via ORAL
  Filled 2012-09-12 (×4): qty 1

## 2012-09-12 MED ORDER — CHLORDIAZEPOXIDE HCL 25 MG PO CAPS
25.0000 mg | ORAL_CAPSULE | Freq: Once | ORAL | Status: AC
  Administered 2012-09-12: 25 mg via ORAL
  Filled 2012-09-12 (×2): qty 1

## 2012-09-12 MED ORDER — THIAMINE HCL 100 MG/ML IJ SOLN: 100.0000 mg | Freq: Once | INTRAMUSCULAR | Status: DC

## 2012-09-12 MED ORDER — HYDROXYZINE HCL 25 MG PO TABS
25.0000 mg | ORAL_TABLET | Freq: Four times a day (QID) | ORAL | Status: AC | PRN
  Administered 2012-09-12 – 2012-09-14 (×2): 25 mg via ORAL
  Filled 2012-09-12: qty 1

## 2012-09-12 MED ORDER — LOPERAMIDE HCL 2 MG PO CAPS: 2.0000 mg | ORAL_CAPSULE | ORAL | Status: DC | PRN

## 2012-09-12 MED ORDER — CHLORDIAZEPOXIDE HCL 25 MG PO CAPS
25.0000 mg | ORAL_CAPSULE | Freq: Every day | ORAL | Status: AC
  Filled 2012-09-12: qty 1

## 2012-09-12 MED ORDER — CLONIDINE HCL 0.1 MG PO TABS
0.1000 mg | ORAL_TABLET | Freq: Four times a day (QID) | ORAL | Status: AC
  Administered 2012-09-12 – 2012-09-14 (×8): 0.1 mg via ORAL
  Filled 2012-09-12 (×10): qty 1

## 2012-09-12 MED ORDER — METHOCARBAMOL 500 MG PO TABS
500.0000 mg | ORAL_TABLET | Freq: Three times a day (TID) | ORAL | Status: DC | PRN
  Administered 2012-09-14 – 2012-09-16 (×3): 500 mg via ORAL
  Filled 2012-09-12 (×3): qty 1

## 2012-09-12 MED ORDER — CHLORDIAZEPOXIDE HCL 25 MG PO CAPS
25.0000 mg | ORAL_CAPSULE | ORAL | Status: AC
  Administered 2012-09-15: 25 mg via ORAL
  Filled 2012-09-12 (×3): qty 1

## 2012-09-12 MED ORDER — ACETAMINOPHEN 325 MG PO TABS: 650.0000 mg | ORAL_TABLET | Freq: Four times a day (QID) | ORAL | Status: DC | PRN

## 2012-09-12 MED ORDER — CLONIDINE HCL 0.1 MG PO TABS
0.1000 mg | ORAL_TABLET | ORAL | Status: AC
  Administered 2012-09-15 – 2012-09-16 (×2): 0.1 mg via ORAL
  Filled 2012-09-12 (×4): qty 1

## 2012-09-12 MED ORDER — IBUPROFEN 200 MG PO TABS: 600.0000 mg | ORAL_TABLET | Freq: Three times a day (TID) | ORAL | Status: DC | PRN

## 2012-09-12 MED ORDER — MAGNESIUM HYDROXIDE 400 MG/5ML PO SUSP: 30.0000 mL | Freq: Every day | ORAL | Status: DC | PRN

## 2012-09-12 MED ORDER — ALUM & MAG HYDROXIDE-SIMETH 200-200-20 MG/5ML PO SUSP: 30.0000 mL | ORAL | Status: DC | PRN

## 2012-09-12 MED ORDER — ONDANSETRON 4 MG PO TBDP: 4.0000 mg | ORAL_TABLET | Freq: Four times a day (QID) | ORAL | Status: DC | PRN

## 2012-09-12 MED ORDER — HYDROXYZINE HCL 25 MG PO TABS: 25.0000 mg | ORAL_TABLET | Freq: Four times a day (QID) | ORAL | Status: DC | PRN

## 2012-09-12 MED ORDER — LOPERAMIDE HCL 2 MG PO CAPS: 2.0000 mg | ORAL_CAPSULE | ORAL | Status: AC | PRN

## 2012-09-12 NOTE — Tx Team (Signed)
Initial Interdisciplinary Treatment Plan  PATIENT STRENGTHS: (choose at least two) Ability to communicate Verbalizes desire for long term treatment PATIENT STRESSORS: Health problems Legal issue Marital or family conflict Substance abuse   PROBLEM LIST: Problem List/Patient Goals Date to be addressed Date deferred Reason deferred Estimated date of resolution  Polysubstance abuse 09/12/2012   D/C        Passive SI R/T family conflict 09/12/2012   D/C                                       DISCHARGE CRITERIA:  Ability to meet basic life and health needs Adequate post-discharge living arrangements Improved stabilization in mood, thinking, and/or behavior Motivation to continue treatment in a less acute level of care Verbal commitment to aftercare and medication compliance Withdrawal symptoms are absent or subacute and managed without 24-hour nursing intervention  PRELIMINARY DISCHARGE PLAN: Attend PHP/IOP Attend 12-step recovery group Outpatient therapy  PATIENT/FAMIILY INVOLVEMENT: This treatment plan has been presented to and reviewed with the patient, Rachel Mcdaniel, and/or family member, .  The patient and family have been given the opportunity to ask questions and make suggestions.  Rachel Mcdaniel 09/12/2012, 6:27 PM

## 2012-09-12 NOTE — Consult Note (Signed)
Reviewed the information documented and agree with the treatment plan.  Kanye Depree,JANARDHAHA R. 09/12/2012 4:39 PM

## 2012-09-12 NOTE — ED Provider Notes (Signed)
History     CSN: [id number]  Arrival date & time 09/12/12  1128   First MD Initiated Contact with Patient 09/12/12 1155      Chief Complaint  Patient presents with  . Medical Clearance    (Consider location/radiation/quality/duration/timing/severity/associated sxs/prior treatment) HPI  Rachel Mcdaniel 27 year old female with a past medical history of chronic pain, endometriosis, anxiety and borderline personality disorder who has been abusing opiate pain medication and Xanax for the past 3 years.  Patient states that she has patent upper 40 oxycodone at a time without any significant mental impairment.  The patient states he has been on a prescription of 340 10 mg Percocet monthly for the past 4 years.  She has also been abusing her Xanax prescription.  Patient states that she has been wanting to come off of the medication to get her pain is less severe now that she has had a total hysterectomy.  The patient states that she was trying to come off on her own and had her first seizure on Wednesday of this past week.  She went to behavioral health who stated she needed to come to the emergency department to be medically cleared for psychiatric evaluation and detox.  The patient states she got scared and went home and began taking her medications again.  She denies any secondary seizures.  She has had some nausea and vomiting.  Her last use of medications was last night.  Patient states that her husband has obtained temporary custody of their baby and she is very upset about this fact.  The patient states that she wants detox from her Xanax but does not really want to come off her pain medications.  Past Medical History  Diagnosis Date  . 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 Tobacco abuse   . Endometriosis   . HTN (hypertension)     Hx PIH w/ preg; now borderline - no meds  . Anemia     history  . Anxiety   . Headache     OTC meds prn  . Depression     History - no meds  .  Neuromuscular disorder     Past Surgical History  Procedure Laterality Date  . Exploratory laparotomy  11/11/07    LOA, Right ovarian cystectomy  . Diagnostic laparoscopy  05/16/10    Convert Exp Laparotomy, LOA, Fulguration of endometriosis, Right oophorectomy  . Svd  2010    x 1  . Laparoscopic hysterectomy  05/19/2011    Procedure: HYSTERECTOMY TOTAL LAPAROSCOPIC;  Surgeon: Tereso Newcomer, MD;  Location: [facility];  Service: Gynecology;  Laterality: N/A;  . Salpingoophorectomy  05/19/2011    Procedure: SALPINGO OOPHERECTOMY;  Surgeon: Tereso Newcomer, MD;  Location: [facility];  Service: Gynecology;  Laterality: Bilateral;  . Cystoscopy  05/19/2011    Procedure: CYSTOSCOPY;  Surgeon: Tereso Newcomer, MD;  Location: [facility];  Service: Gynecology;  Laterality: N/A;  . Abdominal hysterectomy    . Cesarean section      Family History  Problem Relation Age of Onset  . Hypertension Father   . Depression Mother   . Lupus Mother   . Cancer Mother     History  Substance Use Topics  . Smoking status: Former Smoker    Quit date: 09/10/2007  . Smokeless tobacco: Never Used     Comment: down to 1 cig per day  . Alcohol Use: No    OB History   Grav Para Term Preterm  Abortions TAB SAB Ect Mult Living   1 1 1       1       Review of Systems Ten systems reviewed and are negative for acute change, except as noted in the HPI.   Allergies  Review of patient's allergies indicates no known allergies.  Home Medications   Current Outpatient Rx  Name  Route  Sig  Dispense  Refill  . ALPRAZolam (XANAX) 1 MG tablet   Oral   Take 1 mg by mouth 3 (three) times daily as needed for anxiety.         Marland Kitchen buPROPion (WELLBUTRIN) 75 MG tablet   Oral   Take 75 mg by mouth 2 (two) times daily.         . cyanocobalamin (,VITAMIN B-12,) 1000 MCG/ML injection   Intramuscular   Inject 1,000 mcg into the muscle every 30 (thirty) days.         Marland Kitchen FLUoxetine (PROZAC) 40 MG capsule   Oral   Take 80  mg by mouth daily.          Marland Kitchen oxyCODONE-acetaminophen (PERCOCET/ROXICET) 5-325 MG per tablet   Oral   Take 1 tablet by mouth every 4 (four) hours as needed for pain.   8 tablet   0     Prescription written in conjunction with antiemeti .Marland KitchenMarland Kitchen     BP 128/71  Pulse 85  Temp(Src) 98 F (36.7 C)  Resp 20  SpO2 97%  LMP 04/15/2011  Physical Exam Physical Exam  Nursing note and vitals reviewed. Constitutional: She is oriented to person, place, and time. She appears well-developed and well-nourished. No distress.  HENT:  Head: Normocephalic and atraumatic.  Eyes: Conjunctivae normal and EOM are normal. Pupils are equal, round, and reactive to light. No scleral icterus.  Neck: Normal range of motion.  Cardiovascular: Normal rate, regular rhythm and normal heart sounds.  Exam reveals no gallop and no friction rub.   No murmur heard. Pulmonary/Chest: Effort normal and breath sounds normal. No respiratory distress.  Abdominal: Soft. Bowel sounds are normal. She exhibits no distension and no mass. There is no tenderness. There is no guarding.  Neurological: She is alert and oriented to person, place, and time.  Skin: Skin is warm and dry. She is not diaphoretic.    ED Course  Procedures (including critical care time)  Labs Reviewed  CBC WITH DIFFERENTIAL - Abnormal; Notable for the following:    Platelets 436 (*)    All other components within normal limits  BASIC METABOLIC PANEL - Abnormal; Notable for the following:    Glucose, Bld 104 (*)    All other components within normal limits  URINALYSIS, ROUTINE W REFLEX MICROSCOPIC - Abnormal; Notable for the following:    Color, Urine AMBER (*)    Specific Gravity, Urine >1.046 (*)    Bilirubin Urine SMALL (*)    All other components within normal limits  SALICYLATE LEVEL - Abnormal; Notable for the following:    Salicylate Lvl <2.0 (*)    All other components within normal limits  ACETAMINOPHEN LEVEL - Abnormal; Notable for the  following:    Acetaminophen (Tylenol), Serum 31.5 (*)    All other components within normal limits  ETHANOL  URINE RAPID DRUG SCREEN (HOSP PERFORMED)  POCT PREGNANCY, URINE   No results found.   1. Narcotic addiction   2. Agoraphobia with panic disorder   3. Endometriosis   4. Major depressive disorder, single episode, moderate  MDM  2:38 PM BP 128/71  Pulse 85  Temp(Src) 98 F (36.7 C)  Resp 20  SpO2 97%  LMP 04/15/2011 The patient has been seen and evaluated by myself. The patient has also been evaluated by nurse practitioner Latina Craver who evaluated the patient for detoxification.  Patient agrees to inpatient detox.  The patient denies any suicidal ideation, homicidal ideation or audiovisual hallucinations.  This is in opposition to the intake  performed by the nurse.  She appears safe for her psychiatric evaluation appear      Arthor Captain, PA-C 09/13/12 1610

## 2012-09-12 NOTE — ED Notes (Signed)
Pt mother-in-law left call back number [phone].
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
## 2012-09-12 NOTE — BH Assessment (Signed)
[facility] Assessment Progress Note     Pt accepted to [facility] by Derwood Kaplan Ranking NP to services of Dr. Dub Mikes at [phone].  Pt will go by security.  Nurse will call report to [facility] prior to sending pt to [phone].    Nurse will send paper work with pt
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
## 2012-09-13 ENCOUNTER — Encounter (HOSPITAL_COMMUNITY): Payer: Self-pay | Admitting: Psychiatry

## 2012-09-13 DIAGNOSIS — F1120 Opioid dependence, uncomplicated: Secondary | ICD-10-CM | POA: Diagnosis present

## 2012-09-13 DIAGNOSIS — F1320 Sedative, hypnotic or anxiolytic dependence, uncomplicated: Secondary | ICD-10-CM | POA: Diagnosis present

## 2012-09-13 DIAGNOSIS — F331 Major depressive disorder, recurrent, moderate: Secondary | ICD-10-CM

## 2012-09-13 MED ORDER — TRAZODONE HCL 50 MG PO TABS
50.0000 mg | ORAL_TABLET | Freq: Every day | ORAL | Status: DC
  Administered 2012-09-13 – 2012-09-14 (×2): 50 mg via ORAL
  Filled 2012-09-13 (×4): qty 1

## 2012-09-13 MED ORDER — FLUOXETINE HCL 20 MG PO CAPS
80.0000 mg | ORAL_CAPSULE | Freq: Every day | ORAL | Status: DC
  Administered 2012-09-13: 80 mg via ORAL
  Filled 2012-09-13 (×5): qty 4

## 2012-09-13 MED ORDER — GABAPENTIN 100 MG PO CAPS
100.0000 mg | ORAL_CAPSULE | Freq: Three times a day (TID) | ORAL | Status: DC
  Administered 2012-09-13 – 2012-09-15 (×4): 100 mg via ORAL
  Filled 2012-09-13 (×13): qty 1

## 2012-09-13 MED ORDER — CYANOCOBALAMIN 1000 MCG/ML IJ SOLN
1000.0000 ug | INTRAMUSCULAR | Status: DC
  Administered 2012-09-13: 1000 ug via INTRAMUSCULAR
  Filled 2012-09-13: qty 1
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
## 2012-09-13 NOTE — Progress Notes (Signed)
Patient ID: Rachel Mcdaniel, female   DOB: 08/17/1985, 27 y.o.   MRN: [id number] She was in bed  Till almost 12 noon then she was up talking , interacting with peers and staff. His Am she was in bed c/o feeling sick, said it was not withdrawal, c/o sore throat refused her AM medication and slept till she got up. After she was up she has had no c.o of above.
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
## 2012-09-13 NOTE — Tx Team (Signed)
Interdisciplinary Treatment Plan Update (Adult)  Date: 09/13/2012  Time Reviewed: 10:14AM   Progress in Treatment: Attending groups: Yes Participating in groups: Yes Taking medication as prescribed:  Yes Tolerating medication:  Yes Family/Significant othe contact made: Not as yet Patient understands diagnosis: Yes Discussing patient identified problems/goals with staff: Yes Medical problems stabilized or resolved:  Yes Denies suicidal/homicidal ideation: Conflicting reports at admit; two previous attempts Patient has not harmed self or Others: Yes  New problem(s) identified: None Identified  Discharge Plan or Barriers:  CSW will follow up with patient's report that she is to go to [facility]  Additional comments: N/A  Reason for Continuation of Hospitalization: Medication stabilization Suicidal Ideation Withdrawal symptoms   Estimated length of stay: 3-5 days  For review of initial/current patient goals, please see plan of care.  Attendees: Patient:     Family:     Physician:  Geoffery Lyons 09/13/2012 10:14 AM   Nursing:   Roswell Miners, RN 09/13/2012 10:14 AM   Clinical Social Worker Santina Evans Harrill 09/13/2012 10:14 AM   Other:  Robbie Louis, RN 09/13/2012 10:14 AM   Other:     Other:     Other:      Scribe for Treatment Team:   Carney Bern, LCSWA  09/13/2012 10:14 AM

## 2012-09-13 NOTE — ED Provider Notes (Signed)
Medical screening examination/treatment/procedure(s) were performed by non-physician practitioner and as supervising physician I was immediately available for consultation/collaboration.   Benny Lennert, MD 09/13/12 [phone]

## 2012-09-13 NOTE — Progress Notes (Signed)
Adult Psychoeducational Group Note  Date:  09/13/2012 Time:  11:21 AM  Group Topic/Focus:  Theraputic Activity Group - Apples to Apples   Participation Level:  Did Not Attend  Participation Quality:  did not attend  Affect:  did not attend  Cognitive:  did not attend  Insight: None  Engagement in Group:  did not attend   Modes of Intervention:  Activity  Additional Comments:  [name] did not attend group although she was invited to join.   Nichola Sizer 09/13/2012, 11:21 AM

## 2012-09-13 NOTE — Progress Notes (Signed)
D: Patient resting in bed with eyes closed.  Respirations even and unlabored.  Patient appears to be in no apparent distress. A: Staff to monitor Q 15 mins for safety.   R:Patient remains safe on the unit.  
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
## 2012-09-13 NOTE — Progress Notes (Signed)
[facility] Group Notes:  (Nursing/MHT/Case Management/Adjunct)  Date:  09/12/2012   Time:  2100     Type of Therapy:  wrap up group  Participation Level:  Active  Participation Quality:  Attentive, Intrusive, Redirectable, Sharing and Supportive  Affect:  Excited and Labile  Cognitive:  Appropriate  Insight:  Lacking  Engagement in Group:  Distracting and Engaged  Modes of Intervention:  Clarification, Education and Support  Summary of Progress/Problems: Pt reports her borderline personality disorder is getting in the way of her being a good mother and wife. Pt mentions that her being here will look better in court. Pt wants her life back and [facility].  Shelah Lewandowsky 09/13/2012, 3:03 AM
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
## 2012-09-14 DIAGNOSIS — F39 Unspecified mood [affective] disorder: Secondary | ICD-10-CM

## 2012-09-14 MED ORDER — FLUOXETINE HCL 20 MG PO CAPS: 80.0000 mg | ORAL_CAPSULE | Freq: Every day | ORAL | Status: DC

## 2012-09-14 MED ORDER — FLUOXETINE HCL 20 MG PO CAPS
80.0000 mg | ORAL_CAPSULE | Freq: Every day | ORAL | Status: DC
  Administered 2012-09-14 – 2012-09-16 (×3): 80 mg via ORAL
  Filled 2012-09-14 (×3): qty 4

## 2012-09-14 MED ORDER — VITAMIN B-1 100 MG PO TABS
100.0000 mg | ORAL_TABLET | Freq: Every day | ORAL | Status: DC
  Administered 2012-09-14 – 2012-09-19 (×6): 100 mg via ORAL
  Filled 2012-09-14 (×7): qty 1

## 2012-09-14 MED ORDER — ADULT MULTIVITAMIN W/MINERALS CH
1.0000 | ORAL_TABLET | Freq: Every day | ORAL | Status: DC
  Administered 2012-09-14 – 2012-09-19 (×6): 1 via ORAL
  Filled 2012-09-14 (×7): qty 1

## 2012-09-14 NOTE — Progress Notes (Signed)
Patient ID: Rachel Mcdaniel, female   DOB: 1985-06-30, 27 y.o.   MRN: [id number]  D: Patient with bright affect and is very pleasant on assessment. Pt interacting well with peers on unit and is attending group activities.  A: Q 15 minute safety checks for safety, encourage staff/peer interaction and group participation. Administer medications as ordered by MD. R: Pt compliant with medications; no inappropriate behaviors noted at this time.

## 2012-09-14 NOTE — Progress Notes (Signed)
Recreation Therapy Notes  Date: 05.27.2014 Time: 2:45pm Location: [address] Day Room      Group Topic/Focus: Animal Assist Activities/Therapy (AAA/T)  Participation Level: Active  Participation Quality: Appropriate  Affect: Euthymic  Cognitive: Appropriate  Additional Comments:05.27.2014 Session = AAA Session; Dog Team = Tamiami & handler  Patient pet and visited with [place]. Patient interacted appropriately with dog team, LRT and peers.   Ellyce Lafevers L Cherita Hebel, LRT/CTRS  Eshaan Titzer L 09/14/2012 4:01 PM 

## 2012-09-14 NOTE — BHH Group Notes (Signed)
[facility] LCSW Aftercare Discharge Planning Group Note   09/14/2012 8:45 AM  Participation Quality:  Did Not Attend  Rachel Mcdaniel, LCSWA 09/14/2012 9:56 AM
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
## 2012-09-14 NOTE — B[facility] Group Notes (Signed)
[facility] LCSW Group Therapy  09/14/2012  1:15 PM   Type of Therapy:  Group Therapy  Participation Level:  Active  Participation Quality:  Appropriate  Affect:  Appropriate  Cognitive:  Alert and Oriented  Insight:  Limited  Engagement in Therapy:  Engaged  Modes of Intervention:  Clarification, Confrontation, Discussion, Education, Exploration, Limit-setting, Orientation, Problem-solving, Rapport Building, Dance movement psychotherapist, Socialization and Support  Summary of Progress/Problems: Patient was attentive and engaged with speaker from [employer].  Patient expressed interest in their programs and services and received information on their agency.  [name] processed her frustrations with husband's lack of support as he has not contacted her here and feels he should have by now; group facilitator and others shared that what he does or says at this point is much less important that what she does and says.      Reyes Ivan, LCSWA 09/14/2012 1:12 PM

## 2012-09-14 NOTE — Progress Notes (Signed)
Adult Psychoeducational Group Note  Date:  09/14/2012 Time:  12:23 AM  Group Topic/Focus:  Wrap-Up Group:   The focus of this group is to help patients review their daily goal of treatment and discuss progress on daily workbooks.  Participation Level:  Active  Participation Quality:  Sharing  Affect:  Angry and Anxious  Cognitive:  Alert  Insight: Improving  Engagement in Group:  Developing/Improving  Modes of Intervention:  Education and Limit-setting  Additional Comments:    Humberto Seals Monique 09/14/2012, 12:23 AM

## 2012-09-14 NOTE — Progress Notes (Signed)
Pt attended AA group in its entirety.  

## 2012-09-14 NOTE — Progress Notes (Signed)
Adult Psychoeducational Group Note  Date:  09/14/2012 Time:  11:11 AM  Group Topic/Focus:  Recovery Goals:   The focus of this group is to identify appropriate goals for recovery and establish a plan to achieve them.  Participation Level:  Did Not Attend  Participation Quality:  did not attend  Affect:  did not attend  Cognitive:  did not attend  Insight: None  Engagement in Group:  did not attend  Modes of Intervention:  Education and Support  Additional Comments:  [name] did not attend group.   Nichola Sizer 09/14/2012, 11:11 AM

## 2012-09-14 NOTE — Progress Notes (Signed)
Patient ID: Rachel Mcdaniel, female   DOB: 1985/07/06, 27 y.o.   MRN: [id number] D: pt. Visible on the unit in dayroom and on phone. Pt. Reports depression at "7" of 10. Pt. Reports she did not come for detox but to be referred to [facility] for borderline traits.  Pt. Says she detox 5-6 months ago off of Xanax and Percocet. "I had three surgeries in three years and they put me on Percocet and Xanax. A: Writer introduced self to client and reviewed med administration times.Staff will monitor q34min for safety. Writer encouraged group. R: Pt. Is safe on the unit. Pt. Attended group.
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
## 2012-09-14 NOTE — Progress Notes (Signed)
Recreation Therapy Notes  Date: 05.27.2014 Time: 3:15pm Location: [address] Dayroom      Group Topic/Focus: Communication, Journalist, newspaper, Team Work  Participation Level: Active  Participation Quality: Appropriate, Attentive, Sharing and Supportive  Affect: Euthymic  Cognitive: Appropriate   Additional Comments: Activity: Flip Flop ; Explanation: Patients were asked to stand on a sheet, working together as a group patients were asked to flip the sheet over without stepping off the sheet.   Patient actively participated in group activity. Patient offered suggestions to group to accomplishing task. Patient participated in wrap up discussion about skills needed to complete activity. Patient shared personal stories with the group, as well as related to stories shared by her peers.   Marykay Lex Irineo Gaulin, LRT/CTRS  Jearl Klinefelter 09/14/2012 4:24 PM

## 2012-09-14 NOTE — Progress Notes (Signed)
[facility] MD Progress Note  09/14/2012 12:43 PM VELA RENDER  MRN:  [id number] Subjective:  Rachel Mcdaniel states that she is feeling very overwhelmed. She is at a point that she will lose her family if she does not do something about her addiction. States she got dependent  On opioids after she used them to treat pain and then started using them to deal with her mood fluctuations. States that she is not going to get another chance to try to save her family. She endorses shame and guilt for having placed her addiction in front of them. Diagnosis:  Opioid/Benzodiazepine Dependence/Mood Disorder NOS  ADL's:  Intact  Sleep: Fair  Appetite:  Fair  Suicidal Ideation:  Plan:  denies Intent:  denies Means:  denies Homicidal Ideation:  Plan:  denies Intent:  denies Means:  denies AEB (as evidenced by):  Psychiatric Specialty Exam: Review of Systems  Constitutional: Positive for malaise/fatigue.  HENT: Negative.   Eyes: Negative.   Respiratory: Negative.   Cardiovascular: Negative.   Gastrointestinal: Negative.   Genitourinary: Negative.   Musculoskeletal: Positive for myalgias.  Skin: Negative.   Neurological: Positive for dizziness, tremors and weakness.  Endo/Heme/Allergies: Negative.   Psychiatric/Behavioral: Positive for depression and substance abuse. The patient is nervous/anxious and has insomnia.     Blood pressure 129/92, pulse 106, temperature 98 F (36.7 C), temperature source Oral, resp. rate 20, height 5\' 5"  (1.651 m), weight 82.555 kg (182 lb), last menstrual period 04/15/2011, SpO2 100.00%.Body mass index is 30.29 kg/(m^2).  General Appearance: Fairly Groomed  Patent attorney::  Fair  Speech:  Clear and Coherent  Volume:  Normal  Mood:  Anxious, Depressed and worried  Affect:  Depressed, Tearful and anxious, worried  Thought Process:  Coherent and Goal Directed  Orientation:  Full (Time, Place, and Person)  Thought Content:  worries, concerns  Suicidal Thoughts:  No   Homicidal Thoughts:  No  Memory:  Immediate;   Fair Recent;   Fair Remote;   Fair  Judgement:  Fair  Insight:  Present  Psychomotor Activity:  Restlessness  Concentration:  Fair  Recall:  Fair  Akathisia:  No  Handed:  Right  AIMS (if indicated):     Assets:  Desire for Improvement  Sleep:  Number of Hours: 5   Current Medications: Current Facility-Administered Medications  Medication Dose Route Frequency Provider Last Rate Last Dose  . alum & mag hydroxide-simeth (MAALOX/MYLANTA) 200-200-20 MG/5ML suspension 30 mL  30 mL Oral Q4H PRN Shuvon Rankin, NP      . chlordiazePOXIDE (LIBRIUM) capsule 25 mg  25 mg Oral Q6H PRN Shuvon Rankin, NP      . chlordiazePOXIDE (LIBRIUM) capsule 25 mg  25 mg Oral TID Shuvon Rankin, NP   25 mg at 09/14/12 1156   Followed by  . [START ON 09/15/2012] chlordiazePOXIDE (LIBRIUM) capsule 25 mg  25 mg Oral BH-qamhs Shuvon Rankin, NP       Followed by  . [START ON 09/16/2012] chlordiazePOXIDE (LIBRIUM) capsule 25 mg  25 mg Oral Daily Shuvon Rankin, NP      . cloNIDine (CATAPRES) tablet 0.1 mg  0.1 mg Oral QID Shuvon Rankin, NP   0.1 mg at 09/14/12 1157   Followed by  . [START ON 09/15/2012] cloNIDine (CATAPRES) tablet 0.1 mg  0.1 mg Oral BH-qamhs Shuvon Rankin, NP       Followed by  . [START ON 09/17/2012] cloNIDine (CATAPRES) tablet 0.1 mg  0.1 mg Oral QAC breakfast Shuvon Rankin, NP      .  cyanocobalamin ((VITAMIN B-12)) injection 1,000 mcg  1,000 mcg Intramuscular Q30 days Sanjuana Kava, NP   1,000 mcg at 09/13/12 1720  . dicyclomine (BENTYL) tablet 20 mg  20 mg Oral Q6H PRN Shuvon Rankin, NP      . FLUoxetine (PROZAC) capsule 80 mg  80 mg Oral Q1200 Rachael Fee, MD   80 mg at 09/14/12 1156  . gabapentin (NEURONTIN) capsule 100 mg  100 mg Oral TID Sanjuana Kava, NP   100 mg at 09/14/12 1157  . hydrOXYzine (ATARAX/VISTARIL) tablet 25 mg  25 mg Oral Q6H PRN Shuvon Rankin, NP   25 mg at 09/12/12 2134  . loperamide (IMODIUM) capsule 2-4 mg  2-4 mg Oral PRN  Shuvon Rankin, NP      . magnesium hydroxide (MILK OF MAGNESIA) suspension 30 mL  30 mL Oral Daily PRN Shuvon Rankin, NP      . methocarbamol (ROBAXIN) tablet 500 mg  500 mg Oral Q8H PRN Shuvon Rankin, NP      . multivitamin with minerals tablet 1 tablet  1 tablet Oral Q1200 Rachael Fee, MD   1 tablet at 09/14/12 1157  . naproxen (NAPROSYN) tablet 500 mg  500 mg Oral BID PRN Shuvon Rankin, NP   500 mg at 09/13/12 1200  . nicotine (NICODERM CQ - dosed in mg/24 hours) patch 21 mg  21 mg Transdermal Daily Rachael Fee, MD   21 mg at 09/14/12 [phone]  . ondansetron (ZOFRAN-ODT) disintegrating tablet 4 mg  4 mg Oral Q6H PRN Shuvon Rankin, NP      . thiamine (B-1) injection 100 mg  100 mg Intramuscular Once Shuvon Rankin, NP      . thiamine (VITAMIN B-1) tablet 100 mg  100 mg Oral Q1200 Rachael Fee, MD   100 mg at 09/14/12 1157  . traZODone (DESYREL) tablet 50 mg  50 mg Oral QHS Sanjuana Kava, NP   50 mg at 09/13/12 2206    Lab Results:  Results for orders placed during the hospital encounter of 09/12/12 (from the past 48 hour(s))  URINALYSIS, ROUTINE W REFLEX MICROSCOPIC     Status: Abnormal   Collection Time    09/12/12  1:42 PM      Result Value Range   Color, Urine AMBER (*) YELLOW   Comment: BIOCHEMICALS MAY BE AFFECTED BY COLOR   APPearance CLEAR  CLEAR   Specific Gravity, Urine >1.046 (*) 1.005 - 1.030   pH 5.0  5.0 - 8.0   Glucose, UA NEGATIVE  NEGATIVE mg/dL   Hgb urine dipstick NEGATIVE  NEGATIVE   Bilirubin Urine SMALL (*) NEGATIVE   Ketones, ur NEGATIVE  NEGATIVE mg/dL   Protein, ur NEGATIVE  NEGATIVE mg/dL   Urobilinogen, UA 1.0  0.0 - 1.0 mg/dL   Nitrite NEGATIVE  NEGATIVE   Leukocytes, UA NEGATIVE  NEGATIVE   Comment: MICROSCOPIC NOT DONE ON URINES WITH NEGATIVE PROTEIN, BLOOD, LEUKOCYTES, NITRITE, OR GLUCOSE <1000 mg/dL.  URINE RAPID DRUG SCREEN (HOSP PERFORMED)     Status: Abnormal   Collection Time    09/12/12  1:43 PM      Result Value Range   Opiates POSITIVE (*)  NONE DETECTED   Cocaine NONE DETECTED  NONE DETECTED   Benzodiazepines POSITIVE (*) NONE DETECTED   Amphetamines POSITIVE (*) NONE DETECTED   Tetrahydrocannabinol POSITIVE (*) NONE DETECTED   Barbiturates NONE DETECTED  NONE DETECTED   Comment:  DRUG SCREEN FOR MEDICAL PURPOSES     ONLY.  IF CONFIRMATION IS NEEDED     FOR ANY PURPOSE, NOTIFY LAB     WITHIN 5 DAYS.                LOWEST DETECTABLE LIMITS     FOR URINE DRUG SCREEN     Drug Class       Cutoff (ng/mL)     Amphetamine      1000     Barbiturate      200     Benzodiazepine   200     Tricyclics       300     Opiates          300     Cocaine          300     THC              50  POCT PREGNANCY, URINE     Status: None   Collection Time    09/12/12  1:57 PM      Result Value Range   Preg Test, Ur NEGATIVE  NEGATIVE   Comment:            THE SENSITIVITY OF THIS     METHODOLOGY IS >24 mIU/mL    Physical Findings: AIMS: Facial and Oral Movements Muscles of Facial Expression: None, normal Lips and Perioral Area: None, normal Jaw: None, normal Tongue: None, normal,Extremity Movements Upper (arms, wrists, hands, fingers): None, normal Lower (legs, knees, ankles, toes): None, normal, Trunk Movements Neck, shoulders, hips: None, normal, Overall Severity Severity of abnormal movements (highest score from questions above): None, normal Incapacitation due to abnormal movements: None, normal, Dental Status Current problems with teeth and/or dentures?: No Does patient usually wear dentures?: No  CIWA:  CIWA-Ar Total: 0 COWS:  COWS Total Score: 7  Treatment Plan Summary: Daily contact with patient to assess and evaluate symptoms and progress in treatment Medication management  Plan: Supportive approach/coping skills/relapse prevention           Pursue and complete the detox protocol           Reassess and address the comorbidities  Medical Decision Making Problem Points:  Review of last therapy session (1)  and Review of psycho-social stressors (1) Data Points:  Review of medication regiment & side effects (2)  I certify that inpatient services furnished can reasonably be expected to improve the patient's condition.   Nasra Counce A 09/14/2012, 12:43 PM

## 2012-09-15 MED ORDER — TRAZODONE HCL 100 MG PO TABS
100.0000 mg | ORAL_TABLET | Freq: Every day | ORAL | Status: DC
  Administered 2012-09-15 – 2012-09-18 (×4): 100 mg via ORAL
  Filled 2012-09-15: qty 4
  Filled 2012-09-15 (×3): qty 1
  Filled 2012-09-15: qty 4
  Filled 2012-09-15: qty 1

## 2012-09-15 MED ORDER — GABAPENTIN 300 MG PO CAPS
300.0000 mg | ORAL_CAPSULE | Freq: Three times a day (TID) | ORAL | Status: DC
  Administered 2012-09-15 – 2012-09-19 (×11): 300 mg via ORAL
  Filled 2012-09-15: qty 1
  Filled 2012-09-15 (×2): qty 12
  Filled 2012-09-15: qty 1
  Filled 2012-09-15: qty 12
  Filled 2012-09-15 (×2): qty 1
  Filled 2012-09-15: qty 12
  Filled 2012-09-15: qty 1
  Filled 2012-09-15 (×2): qty 12
  Filled 2012-09-15 (×6): qty 1

## 2012-09-15 NOTE — [facility] Group Notes (Signed)
[facility] LCSW Aftercare Discharge Planning Group Note   09/15/2012 8:45 AM  Participation Quality:  Did Not Attend  Keali Mccraw Horton, LCSWA 09/15/2012 9:17 AM   

## 2012-09-15 NOTE — Clinical Social Work Note (Signed)
CSW spoke with pt's husband yesterday but he abruptly got off the phone, stating he was at work.  Pt's husband did call back today and CSW spoke with pt's husband at this time.  He states that pt is aware that she is banned from the apartment complex and is not allowed back on the property.  Husband states that she is not allowed to come back home upon d/c and will have to figure out where she will be going.  Husband states that at this time he is distancing himself from pt and is working on separating from her.  Husband is unsure where pt will live upon d/c but states that she is on probation and can't leave the state, but her only family is in [place].  Husband wished pt well on recovery and hopes she can get well so she can be the best mother she can be for their daughter.    Reyes Ivan, LCSWA 09/15/2012  11:26 AM

## 2012-09-15 NOTE — Progress Notes (Signed)
D: Patient appropriate and cooperative with staff and peers. Patient's affect/mood is slightly irritable and depressed. She reported on the self inventory sheet that her sleep/appetite is poor, energy level is low and ability to pay attention is improving. Patient rated depression "5" and feelings of hopelessness "3". She's attending groups and interacting with peers in the dayroom and hallway.  A: Support and encouragement provided to patient. Administered scheduled medications per ordering MD. Monitor Q15 minute checks for safety.  R: Patient receptive. Denies SI/HI/AVH. Patient remains safe on the unit.

## 2012-09-15 NOTE — Tx Team (Signed)
Interdisciplinary Treatment Plan Update (Adult)  Date: 09/15/2012  Time Reviewed:  9:45 AM  Progress in Treatment: Attending groups: Yes Participating in groups:  Yes Taking medication as prescribed:  Yes Tolerating medication:  Yes Family/Significant othe contact made: CSW making attempts Patient understands diagnosis:  Yes Discussing patient identified problems/goals with staff:  Yes Medical problems stabilized or resolved:  Yes Denies suicidal/homicidal ideation: Yes Issues/concerns per patient self-inventory:  Yes Other:  New problem(s) identified: N/A  Discharge Plan or Barriers: Pt has an assessment date at [facility] for further treatment.    Reason for Continuation of Hospitalization: Anxiety Depression Medication Stabilization  Comments: N/A  Estimated length of stay: 3-4 days  For review of initial/current patient goals, please see plan of care.  Attendees: Patient:     Family:     Physician:  Dr. Dub Mikes 09/15/2012 8:32 AM   Nursing:   Harold Barban, RN 09/15/2012 8:32 AM   Clinical Social Worker:  Reyes Ivan, LCSWA 09/15/2012 8:32 AM   Other: Robbie Louis, RN 09/15/2012 8:32 AM   Other:  Ronda Fairly, LCSWA 09/15/2012 8:32 AM  Other:  Liliane Bade, Community Care Coordinator  09/15/2012 8:32 AM  Other:     Other:    Other:    Other:    Other:    Other:    Other:      Scribe for Treatment Team:   Carmina Miller, 09/15/2012 , 8:32 AM

## 2012-09-15 NOTE — Clinical Social Work Note (Signed)
Pt requested a letter that includes admission date and that she is still in the hospital to court and her employer.  CSW fax the letter to [facility] and the [employer] and emailed the letter to [employer], pt's job, per pt's request (see communication tab for letters).  Pt signed consent for both.   Reyes Ivan, LCSWA 09/15/2012  3:03 PM

## 2012-09-15 NOTE — Progress Notes (Signed)
Patient ID: Rachel Mcdaniel, female   DOB: 06/21/1985, 27 y.o.   MRN: [id number] [facility] MD Progress Note  09/15/2012 1:57 PM Rachel Mcdaniel  MRN:  [id number]  Subjective:  [name] states that today, she is feeling a lot of anxiety. She adds that she is not sleeping well. A lot of tossing and turning, may be a couple of hours of not so restful sleep. Blamed it on having to depend on Xanax for many years. She says that she is happy for getting into [facility], but nervous about going home for a week prior to [facility]. She admits still having a lot of cravings. But will fight to save her self and her marriage. Mother, father and husband not talking to her right now. Her mother in law her only support system at this point.  Diagnosis:  Opioid/Benzodiazepine Dependence/Mood Disorder NOS  ADL's:  Intact  Sleep: Fair  Appetite:  Fair  Suicidal Ideation:  Plan:  denies Intent:  denies Means:  denies Homicidal Ideation:  Plan:  denies Intent:  denies Means:  denies A EB (as evidenced by):  Psychiatric Specialty Exam: Review of Systems  Constitutional: Positive for malaise/fatigue.  HENT: Negative.   Eyes: Negative.   Respiratory: Negative.   Cardiovascular: Negative.   Gastrointestinal: Negative.   Genitourinary: Negative.   Musculoskeletal: Positive for myalgias.  Skin: Negative.   Neurological: Positive for dizziness, tremors and weakness.  Endo/Heme/Allergies: Negative.   Psychiatric/Behavioral: Positive for depression and substance abuse. The patient is nervous/anxious and has insomnia.     Blood pressure 109/77, pulse 94, temperature 98.2 F (36.8 C), temperature source Oral, resp. rate 20, height 5\' 5"  (1.651 m), weight 82.555 kg (182 lb), last menstrual period 04/15/2011, SpO2 100.00%.Body mass index is 30.29 kg/(m^2).  General Appearance: Fairly Groomed  Patent attorney::  Fair  Speech:  Clear and Coherent  Volume:  Normal  Mood:  Anxious,  Depressed and worried  Affect:  Depressed, Tearful and anxious, worried  Thought Process:  Coherent and Goal Directed  Orientation:  Full (Time, Place, and Person)  Thought Content:  worries, concerns  Suicidal Thoughts:  No  Homicidal Thoughts:  No  Memory:  Immediate;   Fair Recent;   Fair Remote;   Fair  Judgement:  Fair  Insight:  Present  Psychomotor Activity:  Restlessness  Concentration:  Fair  Recall:  Fair  Akathisia:  No  Handed:  Right  AIMS (if indicated):     Assets:  Desire for Improvement  Sleep:  Number of Hours: 5   Current Medications: Current Facility-Administered Medications  Medication Dose Route Frequency Provider Last Rate Last Dose  . alum & mag hydroxide-simeth (MAALOX/MYLANTA) 200-200-20 MG/5ML suspension 30 mL  30 mL Oral Q4H PRN Shuvon Rankin, NP      . chlordiazePOXIDE (LIBRIUM) capsule 25 mg  25 mg Oral Q6H PRN Shuvon Rankin, NP      . chlordiazePOXIDE (LIBRIUM) capsule 25 mg  25 mg Oral BH-qamhs Shuvon Rankin, NP       Followed by  . [START ON 09/16/2012] chlordiazePOXIDE (LIBRIUM) capsule 25 mg  25 mg Oral Daily Shuvon Rankin, NP      . cloNIDine (CATAPRES) tablet 0.1 mg  0.1 mg Oral BH-qamhs Shuvon Rankin, NP       Followed by  . [START ON 09/17/2012] cloNIDine (CATAPRES) tablet 0.1 mg  0.1 mg Oral QAC breakfast Shuvon Rankin, NP      . cyanocobalamin ((VITAMIN B-12)) injection 1,000 mcg  1,000 mcg Intramuscular Q30 days Sanjuana Kava, NP   1,000 mcg at 09/13/12 1720  . dicyclomine (BENTYL) tablet 20 mg  20 mg Oral Q6H PRN Shuvon Rankin, NP      . FLUoxetine (PROZAC) capsule 80 mg  80 mg Oral Q1200 Rachael Fee, MD   80 mg at 09/15/12 1154  . gabapentin (NEURONTIN) capsule 100 mg  100 mg Oral TID Sanjuana Kava, NP   100 mg at 09/15/12 1154  . hydrOXYzine (ATARAX/VISTARIL) tablet 25 mg  25 mg Oral Q6H PRN Shuvon Rankin, NP   25 mg at 09/14/12 2130  . loperamide (IMODIUM) capsule 2-4 mg  2-4 mg Oral PRN Shuvon Rankin, NP      . magnesium hydroxide  (MILK OF MAGNESIA) suspension 30 mL  30 mL Oral Daily PRN Shuvon Rankin, NP      . methocarbamol (ROBAXIN) tablet 500 mg  500 mg Oral Q8H PRN Shuvon Rankin, NP   500 mg at 09/14/12 2250  . multivitamin with minerals tablet 1 tablet  1 tablet Oral Q1200 Rachael Fee, MD   1 tablet at 09/15/12 1153  . naproxen (NAPROSYN) tablet 500 mg  500 mg Oral BID PRN Shuvon Rankin, NP   500 mg at 09/13/12 1200  . nicotine (NICODERM CQ - dosed in mg/24 hours) patch 21 mg  21 mg Transdermal Daily Rachael Fee, MD   21 mg at 09/15/12 0800  . ondansetron (ZOFRAN-ODT) disintegrating tablet 4 mg  4 mg Oral Q6H PRN Shuvon Rankin, NP      . thiamine (B-1) injection 100 mg  100 mg Intramuscular Once Shuvon Rankin, NP      . thiamine (VITAMIN B-1) tablet 100 mg  100 mg Oral Q1200 Rachael Fee, MD   100 mg at 09/15/12 1154  . traZODone (DESYREL) tablet 50 mg  50 mg Oral QHS Sanjuana Kava, NP   50 mg at 09/14/12 2130    Lab Results:  No results found for this or any previous visit (from the past 48 hour(s)).  Physical Findings: AIMS: Facial and Oral Movements Muscles of Facial Expression: None, normal Lips and Perioral Area: None, normal Jaw: None, normal Tongue: None, normal,Extremity Movements Upper (arms, wrists, hands, fingers): None, normal Lower (legs, knees, ankles, toes): None, normal, Trunk Movements Neck, shoulders, hips: None, normal, Overall Severity Severity of abnormal movements (highest score from questions above): None, normal Incapacitation due to abnormal movements: None, normal Patient's awareness of abnormal movements (rate only patient's report): No Awareness, Dental Status Current problems with teeth and/or dentures?: No Does patient usually wear dentures?: No  CIWA:  CIWA-Ar Total: 0 COWS:  COWS Total Score: 0  Treatment Plan Summary: Daily contact with patient to assess and evaluate symptoms and progress in treatment Medication management  Plan: Supportive approach/coping  skills/relapse prevention. Increase Neurontin 300 mg tid for anxiety/pain control. Increased Trazodone from 50 to 100 mg Q bedtime for sleep. Pursue and complete the detox protocol Reassess and address the comorbidities  Medical Decision Making Problem Points:  Review of last therapy session (1) and Review of psycho-social stressors (1) Data Points:  Review of medication regiment & side effects (2)  I certify that inpatient services furnished can reasonably be expected to improve the patient's condition.   Armandina Stammer I, PMHNP_BC 09/15/2012, 1:57 PM Agree with assessment and plan
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
## 2012-09-16 DIAGNOSIS — F329 Major depressive disorder, single episode, unspecified: Secondary | ICD-10-CM

## 2012-09-16 MED ORDER — VILAZODONE HCL 10 MG PO TABS
10.0000 mg | ORAL_TABLET | Freq: Every day | ORAL | Status: DC
  Administered 2012-09-17 – 2012-09-19 (×3): 10 mg via ORAL
  Filled 2012-09-16: qty 4
  Filled 2012-09-16 (×3): qty 1
  Filled 2012-09-16: qty 4

## 2012-09-16 MED ORDER — HYDROXYZINE HCL 25 MG PO TABS
25.0000 mg | ORAL_TABLET | Freq: Once | ORAL | Status: AC
  Administered 2012-09-16: 25 mg via ORAL

## 2012-09-16 NOTE — [facility] Group Notes (Signed)
[facility] LCSW Group Therapy   09/16/2012 1:15 PM   Type of Therapy: Group Therapy   Participation Level: Active   Participation Quality: Appropriate and Attentive but disruptive at times  Affect: Appropriate   Cognitive: Alert and Appropriate   Insight: Developing/Improving and Engaged   Engagement in Therapy: Developing/Improving and Engaged   Modes of Intervention: Activity, Clarification, Confrontation, Discussion, Education, Exploration, Limit-setting, Orientation, Problem-solving, Rapport Building, Dance movement psychotherapist, Socialization and Support   Summary of Progress/Problems: The topic for group was balance in life. Pt participated in the discussion about when their life was in balance and out of balance and how this feels. Pt discussed ways to get back in balance and short term goals they can work on to get where they want to be. Pt than actively participated in group activity in which they chose two cards with a picture on it which they felt represented balance in their life and life being unbalanced. Pt was able to process feeling her life is unbalanced due lack of structure and order in her life.  Pt states that she worries about growing old with someone and not feeling worth having this or being loved.  Pt had to be redirected at times due to side conversations.    Rachel Mcdaniel, [place]  09/16/2012 2:44 PM

## 2012-09-16 NOTE — Progress Notes (Signed)
[facility] MD Progress Note  09/16/2012 3:59 PM SHEYANNE MUNLEY  MRN:  [id number] Subjective:  Very anxious as she is seeing her mother today and they have always have a conflictive relationship. Still  Has not heard form her husband. Stares he is angry with her. She states she might need to concentrate on herself. Did better on Viibryd as an antidepressant Diagnosis:  Opioid Benzodiazepine Dependence, Major Depression  ADL's:  Intact  Sleep: Fair  Appetite:  Fair  Suicidal Ideation:  Plan:  denies Intent:  denies Means:  denies Homicidal Ideation:  Plan:  denies Intent:  denies Means:  denies AEB (as evidenced by):  Psychiatric Specialty Exam: Review of Systems  Constitutional: Negative.   HENT: Negative.   Eyes: Negative.   Respiratory: Negative.   Cardiovascular: Negative.   Gastrointestinal: Negative.   Genitourinary: Negative.   Musculoskeletal: Negative.   Skin: Negative.   Neurological: Negative.   Endo/Heme/Allergies: Negative.   Psychiatric/Behavioral: Positive for depression. The patient is nervous/anxious.     Blood pressure 106/75, pulse 103, temperature 97.8 F (36.6 C), temperature source Oral, resp. rate 18, height 5\' 5"  (1.651 m), weight 82.555 kg (182 lb), last menstrual period 04/15/2011, SpO2 100.00%.Body mass index is 30.29 kg/(m^2).  General Appearance: Fairly Groomed  Patent attorney::  Fair  Speech:  Clear and Coherent  Volume:  Normal  Mood:  Anxious, Depressed and worried  Affect:  anxious  Thought Process:  Coherent and Goal Directed  Orientation:  Full (Time, Place, and Person)  Thought Content:  worries, concerns  Suicidal Thoughts:  No  Homicidal Thoughts:  No  Memory:  Immediate;   Fair Recent;   Fair Remote;   Fair  Judgement:  Fair  Insight:  Present  Psychomotor Activity:  Restlessness  Concentration:  Fair  Recall:  Fair  Akathisia:  No  Handed:  Right  AIMS (if indicated):     Assets:  Desire for Improvement  Sleep:  Number of  Hours: 5.75   Current Medications: Current Facility-Administered Medications  Medication Dose Route Frequency Provider Last Rate Last Dose  . alum & mag hydroxide-simeth (MAALOX/MYLANTA) 200-200-20 MG/5ML suspension 30 mL  30 mL Oral Q4H PRN Shuvon Rankin, NP      . chlordiazePOXIDE (LIBRIUM) capsule 25 mg  25 mg Oral Daily Shuvon Rankin, NP      . cloNIDine (CATAPRES) tablet 0.1 mg  0.1 mg Oral BH-qamhs Shuvon Rankin, NP   0.1 mg at 09/15/12 2130   Followed by  . [START ON 09/17/2012] cloNIDine (CATAPRES) tablet 0.1 mg  0.1 mg Oral QAC breakfast Shuvon Rankin, NP      . cyanocobalamin ((VITAMIN B-12)) injection 1,000 mcg  1,000 mcg Intramuscular Q30 days Sanjuana Kava, NP   1,000 mcg at 09/13/12 1720  . dicyclomine (BENTYL) tablet 20 mg  20 mg Oral Q6H PRN Shuvon Rankin, NP      . gabapentin (NEURONTIN) capsule 300 mg  300 mg Oral TID Sanjuana Kava, NP   300 mg at 09/16/12 1218  . magnesium hydroxide (MILK OF MAGNESIA) suspension 30 mL  30 mL Oral Daily PRN Shuvon Rankin, NP      . methocarbamol (ROBAXIN) tablet 500 mg  500 mg Oral Q8H PRN Shuvon Rankin, NP   500 mg at 09/15/12 2131  . multivitamin with minerals tablet 1 tablet  1 tablet Oral Q1200 Rachael Fee, MD   1 tablet at 09/16/12 1218  . naproxen (NAPROSYN) tablet 500 mg  500 mg Oral BID  PRN Shuvon Rankin, NP   500 mg at 09/13/12 1200  . nicotine (NICODERM CQ - dosed in mg/24 hours) patch 21 mg  21 mg Transdermal Daily Rachael Fee, MD   21 mg at 09/16/12 1311  . thiamine (B-1) injection 100 mg  100 mg Intramuscular Once Shuvon Rankin, NP      . thiamine (VITAMIN B-1) tablet 100 mg  100 mg Oral Q1200 Rachael Fee, MD   100 mg at 09/16/12 1217  . traZODone (DESYREL) tablet 100 mg  100 mg Oral QHS Sanjuana Kava, NP   100 mg at 09/15/12 2129  . [START ON 09/17/2012] Vilazodone HCl (VIIBRYD) TABS 10 mg  10 mg Oral Daily Rachael Fee, MD        Lab Results: No results found for this or any previous visit (from the past 48  hour(s)).  Physical Findings: AIMS: Facial and Oral Movements Muscles of Facial Expression: None, normal Lips and Perioral Area: None, normal Jaw: None, normal Tongue: None, normal,Extremity Movements Upper (arms, wrists, hands, fingers): None, normal Lower (legs, knees, ankles, toes): None, normal, Trunk Movements Neck, shoulders, hips: None, normal, Overall Severity Severity of abnormal movements (highest score from questions above): None, normal Incapacitation due to abnormal movements: None, normal Patient's awareness of abnormal movements (rate only patient's report): No Awareness, Dental Status Current problems with teeth and/or dentures?: No Does patient usually wear dentures?: No  CIWA:  CIWA-Ar Total: 1 COWS:  COWS Total Score: 0  Treatment Plan Summary: Daily contact with patient to assess and evaluate symptoms and progress in treatment Medication management  Plan:m Supportive approach/coping skills/relapse prevention              D/C Prozac start Viibryd 10 mg daily  Medical Decision Making Problem Points:  Review of last therapy session (1) and Review of psycho-social stressors (1) Data Points:  Review of medication regiment & side effects (2) Review of new medications or change in dosage (2)  I certify that inpatient services furnished can reasonably be expected to improve the patient's condition.   Paislei Dorval A 09/16/2012, 3:59 PM

## 2012-09-16 NOTE — Progress Notes (Signed)
D: Patient's affect/mood is flat and depressed. She reported on the self inventory sheet that her sleep is poor, appetite/ability to pay attention is improving and energy level is low. Patient rated depression and feelings of hopelessness "5". She reported that a change she plans to make to better care for self [facility].  A: Support and encouragement provided to patient. Scheduled medications administered per MD orders. Maintain Q15 minute checks for safety.  R: Patient receptive. Denies SI/HI/AVH. Patient remains safe.

## 2012-09-16 NOTE — Progress Notes (Signed)
Adult Psychoeducational Group Note  Date:  09/16/2012 Time:  11:00am  Group Topic/Focus:  Overcoming Stress:   The focus of this group is to define stress and help patients assess their triggers.  Participation Level:  Active  Participation Quality:  Appropriate  Affect:  Appropriate  Cognitive:  Alert and Appropriate  Insight: Appropriate  Engagement in Group:  Engaged  Modes of Intervention:  Discussion  Additional Comments:  Pt attended group.  Shelly Bombard D 09/16/2012, 1:48 PM

## 2012-09-16 NOTE — [facility] Group Notes (Signed)
[facility] LCSW Aftercare Discharge Planning Group Note   09/16/2012 8:45 AM  Participation Quality:  Did Not Attend  Reyes Ivan, LCSWA 09/16/2012 10:25 AM

## 2012-09-16 NOTE — Progress Notes (Signed)
Patient ID: Rachel Mcdaniel, female   DOB: Oct 08, 1985, 27 y.o.   MRN: [id number] D: Patient crying on approach tonight. States "I feel like I'm having a nervous breakdown". Patient had an unexpected visit with daughter and she realized that she would not see her daughter for a while since she is going to long term treatment. Reports being in the hospital has been very hard for her and that her husband will not have anything to do with her. Patient on both clonidine and librium protocol but no PRN for anxiety. On call notified and given Vistaril 25mg  one time dose. Patient appreciative.  A: Staff will monitor on q 15 minute checks, follow treatment plan, and give meds as ordered. R: Patient still tearful after medication but is trying to talk it through with others.

## 2012-09-16 NOTE — Progress Notes (Signed)
Recreation Therapy Notes  Date: 05.29.2014 Time: 3:00pm  Location: [address] Dayroom  Group Topic/Focus: Stress Managment   Participation Level:  Active   Participation Quality:  Appropriate and Redirectable  Affect:  Euthymic   Cognitive:  Appropriate   Additional Comments: Activity: Guided Imagery & Progressive Muscle Relaxation; Explanation: Patients listened to Guided Imagery Script. LRT instructed patients on completing Progressive Muscle Relaxation.  Patient with peers got the giggles during Guided Imagery, patient took it upon herself to step out of the room to collect herself at this point. Patient returned within two minutes. Patient actively participated in remainder of Guided Imagery and Progressive Muscle Relaxation. Patient educated on benefits of stress management. Patient participated in group discussion about when and where stress management can be used. Patient stated once she got over her giggles she was able to enjoy the activity.   Jearl Klinefelter, LRT/ CTRS  Jearl Klinefelter 09/16/2012 4:23 PM

## 2012-09-17 DIAGNOSIS — F410 Panic disorder [episodic paroxysmal anxiety] without agoraphobia: Secondary | ICD-10-CM

## 2012-09-17 MED ORDER — METHOCARBAMOL 500 MG PO TABS
500.0000 mg | ORAL_TABLET | Freq: Four times a day (QID) | ORAL | Status: DC | PRN
  Administered 2012-09-17 – 2012-09-18 (×3): 500 mg via ORAL
  Filled 2012-09-17 (×3): qty 1

## 2012-09-17 MED ORDER — NAPROXEN 500 MG PO TABS
500.0000 mg | ORAL_TABLET | Freq: Two times a day (BID) | ORAL | Status: DC | PRN
  Administered 2012-09-17 – 2012-09-18 (×2): 500 mg via ORAL
  Filled 2012-09-17 (×3): qty 1

## 2012-09-17 NOTE — Tx Team (Signed)
Interdisciplinary Treatment Plan Update (Adult)  Date: 09/17/2012  Time Reviewed:  9:45 AM  Progress in Treatment: Attending groups: Yes Participating in groups:  Yes Taking medication as prescribed:  Yes Tolerating medication:  Yes Family/Significant othe contact made: Yes Patient understands diagnosis:  Yes Discussing patient identified problems/goals with staff:  Yes Medical problems stabilized or resolved:  Yes Denies suicidal/homicidal ideation: Yes Issues/concerns per patient self-inventory:  Yes Other:  New problem(s) identified: N/A  Discharge Plan or Barriers: Pt will follow up at [facility] for further treatment.    Reason for Continuation of Hospitalization: Anxiety Depression Medication Stabilization  Comments: N/A  Estimated length of stay: 2 days, d/c Sunday   For review of initial/current patient goals, please see plan of care.  Attendees: Patient:     Family:     Physician:  Dr. Dub Mikes 09/17/2012 8:23 AM   Nursing:   Seabron Spates, RN 09/17/2012 8:23 AM   Clinical Social Worker:  Reyes Ivan, LCSWA 09/17/2012 8:23 AM   Other: Alease Frame, RN 09/17/2012 8:23 AM   Other:  Serena Colonel, NP 09/17/2012 8:23 AM  Other:     Other:     Other:    Other:    Other:    Other:    Other:    Other:      Scribe for Treatment Team:   Carmina Miller, 09/17/2012 , 8:23 AM

## 2012-09-17 NOTE — Progress Notes (Signed)
Adult Psychoeducational Group Note  Date:  09/17/2012 Time:  8:24 PM  Group Topic/Focus:  Goals Group:   The focus of this group is to help patients establish daily goals to achieve during treatment and discuss how the patient can incorporate goal setting into their daily lives to aide in recovery.  Participation Level:  Active  Participation Quality:  Appropriate  Affect:  Appropriate  Cognitive:  Appropriate  Insight: Appropriate  Engagement in Group:  Engaged  Modes of Intervention:  Discussion  Additional Comments:  Attended AA  Aldona Lento 09/17/2012, 8:24 PM

## 2012-09-17 NOTE — Progress Notes (Signed)
Patient did attend the evening karaoke group. Pt was engaged, supportive, and participated by singing a couple of songs with a peer pt.

## 2012-09-17 NOTE — Progress Notes (Signed)
D: Patient's affect/mood is appropriate to circumstance; brighter today as opposed to other previous shifts. She reported on the self inventory sheet that her sleep is poor, appetite/ability to pay attention is improving and energy level is low. Patient rated depression "4" and feelings of hopelessness "2". Attending groups and interacting with peers in the milieu.   A: Support and encouragement provided to patient. Administered scheduled medications per ordering MD. Maintain Q15 minute checks for safety.  R: Patient receptive. Denies SI/HI/AVH. Patient remains safe.

## 2012-09-17 NOTE — [facility] Group Notes (Signed)
[facility] LCSW Aftercare Discharge Planning Group Note   09/17/2012 8:45 AM  Participation Quality:  Alert and Appropriate   Mood/Affect:  Appropriate and Bright  Depression Rating:  2  Anxiety Rating:  2  Thoughts of Suicide:  Pt denies SI/HI  Will you contract for safety?   Yes  Current AVH:  No  Plan for Discharge/Comments:  Pt attended discharge planning group and actively participated in group.  CSW provided pt with today's workbook.  Pt states that she will be staying with her mother in law until she goes to [facility] on 6/7.  No further needs voiced by pt at this time.   Transportation Means: Mother in law picking pt up  Supports: Pt names her mother in law as her only support  Rachel Mcdaniel, LCSWA 09/17/2012 9:25 AM

## 2012-09-17 NOTE — [facility] Group Notes (Signed)
[facility] LCSW Group Therapy  09/17/2012  1:15 PM   Type of Therapy:  Group Therapy  Participation Level:  Active  Participation Quality:  Appropriate and Attentive  Affect:  Appropriate  Cognitive:  Alert and Appropriate  Insight:  Developing/Improving and Engaged  Engagement in Therapy:  Developing/Improving and Engaged  Modes of Intervention:  Activity, Clarification, Confrontation, Discussion, Education, Exploration, Limit-setting, Orientation, Problem-solving, Rapport Building, Dance movement psychotherapist, Socialization and Support  Summary of Progress/Problems: The topic for today was feelings about relapse.  Pt discussed what relapse prevention is to them and identified triggers that they are on the path to relapse.  Pt processed their feeling towards relapse and was able to relate to peers.  Pt discussed coping skills that can be used for relapse prevention.   Pt shared that she feels a lack of control right now, feeling like she can't manage her marriage, her daughter and other situations going on outside of the hospital.  Pt was able to process the feeling of not having control, and was able to discuss focusing on herself to get better to gain that control back.  Pt was insightful and open to group discussion.    Reyes Ivan, LCSWA 09/17/2012 2:20 PM

## 2012-09-17 NOTE — [facility] Suicide Risk Assessment (Signed)
[facility] INPATIENT:  Family/Significant Other Suicide Prevention Education  Suicide Prevention Education:  Contact Attempts: Rachel Mcdaniel - mother in law [phone]), (name of family member/significant other) has been identified by the patient as the family member/significant other with whom the patient will be residing, and identified as the person(s) who will aid the patient in the event of a mental health crisis.  With written consent from the patient, two attempts were made to provide suicide prevention education, prior to and/or following the patient's discharge.  We were unsuccessful in providing suicide prevention education.  A suicide education pamphlet was given to the patient to share with family/significant other.  Date and time of first attempt: 09/14/12 @ 3:15 pm Date and time of second attempt: 09/15/12 @ 11:25 am  CSW notes that CSW did speak with pt's husband (see progress note).    Rachel Mcdaniel 09/17/2012, 9:28 AM

## 2012-09-17 NOTE — Progress Notes (Signed)
[facility] MD Progress Note  09/17/2012 3:53 PM DESHON KOSLOWSKI  MRN:  [id number] Subjective:  Having a hard time as she was told by her parents that her husband does not want anything to do for her and that when she gets out of [facility] he does not want her there with him. They mentioned getting a lawyer involved. She states that she needs to hear from him even if it is to communicate the same message, she wants to hear it from him and he  Is not answering her calls Diagnosis:  Opioid Dependence, Benzodiazepine Dependence, Major Depression, Panic Attacks  ADL's:  Intact  Sleep: Fair  Appetite:  Fair  Suicidal Ideation:  Plan:  denies Intent:  denies Means:  denies Homicidal Ideation:  Plan:  denies Intent:  denies Means:  denies AEB (as evidenced by):  Psychiatric Specialty Exam: Review of Systems  Constitutional: Negative.   HENT: Negative.   Eyes: Negative.   Respiratory: Negative.   Cardiovascular: Negative.   Gastrointestinal: Positive for abdominal pain.       Muscular in nature  Genitourinary: Negative.   Musculoskeletal: Negative.   Skin: Negative.   Neurological: Negative.   Endo/Heme/Allergies: Negative.   Psychiatric/Behavioral: Positive for depression and substance abuse. The patient is nervous/anxious.     Blood pressure 109/77, pulse 92, temperature 98 F (36.7 C), temperature source Oral, resp. rate 18, height 5\' 5"  (1.651 m), weight 82.555 kg (182 lb), last menstrual period 04/15/2011, SpO2 100.00%.Body mass index is 30.29 kg/(m^2).  General Appearance: Fairly Groomed  Patent attorney::  Fair  Speech:  Clear and Coherent  Volume:  Decreased  Mood:  Anxious and Depressed  Affect:  Depressed and Tearful  Thought Process:  Coherent and Goal Directed  Orientation:  Full (Time, Place, and Person)  Thought Content:  worries, concerns, sense of loss  Suicidal Thoughts:  No  Homicidal Thoughts:  No  Memory:  Immediate;   Fair Recent;   Fair Remote;   Fair   Judgement:  Fair  Insight:  Present  Psychomotor Activity:  Restlessness  Concentration:  Fair  Recall:  Fair  Akathisia:  No  Handed:  Right  AIMS (if indicated):     Assets:  Desire for Improvement  Sleep:  Number of Hours: 5   Current Medications: Current Facility-Administered Medications  Medication Dose Route Frequency Provider Last Rate Last Dose  . alum & mag hydroxide-simeth (MAALOX/MYLANTA) 200-200-20 MG/5ML suspension 30 mL  30 mL Oral Q4H PRN Shuvon Rankin, NP      . cloNIDine (CATAPRES) tablet 0.1 mg  0.1 mg Oral QAC breakfast Shuvon Rankin, NP   0.1 mg at 09/17/12 0816  . cyanocobalamin ((VITAMIN B-12)) injection 1,000 mcg  1,000 mcg Intramuscular Q30 days Sanjuana Kava, NP   1,000 mcg at 09/13/12 1720  . dicyclomine (BENTYL) tablet 20 mg  20 mg Oral Q6H PRN Shuvon Rankin, NP      . gabapentin (NEURONTIN) capsule 300 mg  300 mg Oral TID Sanjuana Kava, NP   300 mg at 09/17/12 1208  . magnesium hydroxide (MILK OF MAGNESIA) suspension 30 mL  30 mL Oral Daily PRN Shuvon Rankin, NP      . methocarbamol (ROBAXIN) tablet 500 mg  500 mg Oral Q8H PRN Shuvon Rankin, NP   500 mg at 09/16/12 2200  . methocarbamol (ROBAXIN) tablet 500 mg  500 mg Oral Q6H PRN Rachael Fee, MD      . multivitamin with minerals tablet 1 tablet  1 tablet Oral Q1200 Rachael Fee, MD   1 tablet at 09/17/12 1208  . naproxen (NAPROSYN) tablet 500 mg  500 mg Oral BID PRN Shuvon Rankin, NP   500 mg at 09/13/12 1200  . nicotine (NICODERM CQ - dosed in mg/24 hours) patch 21 mg  21 mg Transdermal Daily Rachael Fee, MD   21 mg at 09/17/12 0818  . thiamine (B-1) injection 100 mg  100 mg Intramuscular Once Shuvon Rankin, NP      . thiamine (VITAMIN B-1) tablet 100 mg  100 mg Oral Q1200 Rachael Fee, MD   100 mg at 09/17/12 1208  . traZODone (DESYREL) tablet 100 mg  100 mg Oral QHS Sanjuana Kava, NP   100 mg at 09/16/12 2159  . Vilazodone HCl (VIIBRYD) TABS 10 mg  10 mg Oral Daily Rachael Fee, MD   10 mg at  09/17/12 1610    Lab Results: No results found for this or any previous visit (from the past 48 hour(s)).  Physical Findings: AIMS: Facial and Oral Movements Muscles of Facial Expression: None, normal Lips and Perioral Area: None, normal Jaw: None, normal Tongue: None, normal,Extremity Movements Upper (arms, wrists, hands, fingers): None, normal Lower (legs, knees, ankles, toes): None, normal, Trunk Movements Neck, shoulders, hips: None, normal, Overall Severity Severity of abnormal movements (highest score from questions above): None, normal Incapacitation due to abnormal movements: None, normal Patient's awareness of abnormal movements (rate only patient's report): No Awareness, Dental Status Current problems with teeth and/or dentures?: No Does patient usually wear dentures?: No  CIWA:  CIWA-Ar Total: 0 COWS:  COWS Total Score: 0  Treatment Plan Summary: Daily contact with patient to assess and evaluate symptoms and progress in treatment Medication management  Plan: Supportive approach/coping skills/relapse prevention           Continue and complete the detox             Medical Decision Making Problem Points:  Review of last therapy session (1) and Review of psycho-social stressors (1) Data Points:  Review of medication regiment & side effects (2)  I certify that inpatient services furnished can reasonably be expected to improve the patient's condition.   Letcher Schweikert A 09/17/2012, 3:53 PM

## 2012-09-17 NOTE — Progress Notes (Signed)
Adult Psychoeducational Group Note  Date:  09/17/2012 Time:  10:00AM  Group Topic/Focus:  Relapse Prevention Planning:   The focus of this group is to define relapse and discuss the need for planning to combat relapse.  Participation Level:  Active  Participation Quality:  Appropriate and Attentive  Affect:  Appropriate  Cognitive:  Alert and Appropriate  Insight: Good  Engagement in Group:  Engaged  Modes of Intervention:  Activity  Additional Comments:    Lorin Mercy 09/17/2012, 12:09 PM

## 2012-09-18 MED ORDER — VILAZODONE HCL 10 MG PO TABS: 10.0000 mg | ORAL_TABLET | Freq: Every day | ORAL | Status: DC

## 2012-09-18 MED ORDER — CYANOCOBALAMIN 1000 MCG/ML IJ SOLN: 1000.0000 ug | INTRAMUSCULAR | Status: DC

## 2012-09-18 MED ORDER — TRAZODONE HCL 100 MG PO TABS: 100.0000 mg | ORAL_TABLET | Freq: Every day | ORAL | Status: DC

## 2012-09-18 MED ORDER — GABAPENTIN 300 MG PO CAPS: 300.0000 mg | ORAL_CAPSULE | Freq: Three times a day (TID) | ORAL | Status: DC

## 2012-09-18 NOTE — Progress Notes (Signed)
Psychoeducational Group Note  Date:  09/18/2012 Time:  0945 am  Group Topic/Focus:  Identifying Needs:   The focus of this group is to help patients identify their personal needs that have been historically problematic and identify healthy behaviors to address their needs.  Participation Level:  Active  Participation Quality:  Appropriate and Attentive  Affect:  Appropriate  Cognitive:  Alert and Appropriate  Insight:  Developing/Improving  Engagement in Group:  Developing/Improving  Additional Comments:    Andrena Mews 09/18/2012,3:34 PM

## 2012-09-18 NOTE — Progress Notes (Signed)
During room checks observed pt in room journaling. Inquired as to why pt was not in group but pt stated her day has been so stressful that she could not handle group at this time. Summarized the events of today and stated she is ready to discharge tomorrow. Says she feels comfortable with the plan over the next week until she can enter [facility]. Pt's affect blunted, mood is anxious. Support and reassurance given. Level III obs in place. Denies SI/HI/AVh and remains safe. Lawrence Marseilles

## 2012-09-18 NOTE — [facility] Group Notes (Signed)
[facility] Group Notes: (Clinical Social Work)   09/18/2012      Type of Therapy:  Group Therapy   Participation Level:  Did Not Attend    Ambrose Mantle, LCSW 09/18/2012, 1:01 PM

## 2012-09-18 NOTE — Progress Notes (Signed)
Patient ID: Rachel Mcdaniel, female   DOB: July 25, 1985, 27 y.o.   MRN: [id number]  D: Pt denies SI/HI/AVH. Pt is cooperative. Pt exhibits drug seeking behaviors. Pt states she feels ok, her Grandmother will seed money for her and her mother-in-law to stay in a hotel until Sat, then [facility]. Pt states " I'm done, If I don't do it this time my husband will leave me for good, I'm sick and tired of being sick and tired".  A: Pt was offered support and encouragement. Pt was given scheduled medications. Pt was encourage to attend groups. Q 15 minute checks were done for safety.    R:Pt attends groups and interacts well with peers and staff. Pt is taking medication.Pt receptive to treatment and safety maintained on unit.

## 2012-09-18 NOTE — Progress Notes (Signed)
Patient was unable to arrange a ride and transportation.  She has no other means to leave today.  We will cancel the discharge.  Her mother-in-law will come tomorrow to pick her up.

## 2012-09-18 NOTE — [facility] Suicide Risk Assessment (Addendum)
Suicide Risk Assessment  Discharge Assessment     Demographic Factors:  Caucasian  Mental Status Per Nursing Assessment::   On Admission:     Current Mental Status by Physician: Patient seen today.  Her discharge was canceled yesterday due to lack of transportation and right.  Today patient is doing better and ready for discharge.  She is well groomed well dressed female.  She denies any auditory or visual hallucination.  She denies any active or passive suicidal thoughts or homicidal thoughts.  There were no tremors or shakes present.  There were no paranoia or delusion obsession present at this time.  Her speech is clear and coherent.  Her thought are organized.  She feels much improvement with a treatment.  There were no psychosis present.  She&#39;s alert and oriented x3.  Her insight judgment and impulse control is okay.  There were no tremors or shakes.  Loss Factors: Loss of significant relationship  Historical Factors: Impulsivity  Risk Reduction Factors:   Employed, Positive social support, Positive therapeutic relationship and Positive coping skills or problem solving skills  Continued Clinical Symptoms:  Alcohol/Substance Abuse/Dependencies More than one psychiatric diagnosis  Cognitive Features That Contribute To Risk:  Polarized thinking    Suicide Risk:  Minimal: No identifiable suicidal ideation.  Patients presenting with no risk factors but with morbid ruminations; may be classified as minimal risk based on the severity of the depressive symptoms  Discharge Diagnoses:   AXIS I:  Alcohol Abuse and Opiate dependence AXIS II:  Deferred AXIS III:   Past Medical History  Diagnosis Date  . 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 Tobacco abuse   . Endometriosis   . HTN (hypertension)     Hx PIH w/ preg; now borderline - no meds  . Anemia     history  . Anxiety   . Headache(784.0)     OTC meds prn  . Depression     History - no meds  . Neuromuscular disorder     AXIS IV:  other psychosocial or environmental problems and problems with primary support group AXIS V:  61-70 mild symptoms  Plan Of Care/Follow-up recommendations:  Activity:  As tolerated Diet:  Unchanged from the past Other:  Patient will be followup at [facility]  Is patient on multiple antipsychotic therapies at discharge:  No   Has Patient had three or more failed trials of antipsychotic monotherapy by history:  No  Recommended Plan for Multiple Antipsychotic Therapies: Taper to monotherapy as described:  Patient will be followup for medication management at [facility].  Trenia Tennyson T. 09/19/12

## 2012-09-18 NOTE — Progress Notes (Signed)
Psychoeducational Group Note  Date:  09/18/2012 Time:  0945 am  Group Topic/Focus:  Identifying Needs:   The focus of this group is to help patients identify their personal needs that have been historically problematic and identify healthy behaviors to address their needs.  Participation Level:  Did Not Attend  Rachel Mcdaniel 09/18/2012,10:00 AM

## 2012-09-18 NOTE — Discharge Summary (Signed)
Physician Discharge Summary Note  Patient:  Rachel Mcdaniel is an 27 y.o., female MRN:  [id number] DOB:  06-12-1985 Patient phone:  [phone] (home)  Patient address:   [address] [place] [address] [place] [address],   Date of Admission:  09/12/2012  Date of Discharge: 09/19/12  Reason for Admission:  Opiate/Benzodiazepine dependence  Discharge Diagnoses: Active Problems:   Opioid dependence   Benzodiazepine dependence  Review of Systems  Constitutional: Negative.   HENT: Negative.   Eyes: Negative.   Respiratory: Negative.   Cardiovascular: Negative.   Gastrointestinal: Negative.   Genitourinary: Negative.   Musculoskeletal: Negative.   Skin: Negative.   Neurological: Negative.   Endo/Heme/Allergies: Negative.   Psychiatric/Behavioral: Positive for depression (Stabilized with medication prior to discharge) and substance abuse (Hx opiate and benzodiazepine dependence). Negative for suicidal ideas, hallucinations and memory loss. The patient is nervous/anxious (Stabilized with medication prior to discharge) and has insomnia (Stabilized with medication prior to discharge).    Axis Diagnosis:   AXIS I:  Benzodiazepine dependence, Opioid dependence AXIS II:  Deferred AXIS III:   Past Medical History  Diagnosis Date  . 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 Tobacco abuse   . Endometriosis   . HTN (hypertension)     Hx PIH w/ preg; now borderline - no meds  . Anemia     history  . Anxiety   . Headache(784.0)     OTC meds prn  . Depression     History - no meds  . Neuromuscular disorder    AXIS IV:  other psychosocial or environmental problems and Substance abuse/dependence AXIS V:  63  Level of Care:  [facility]  Hospital Course: This is a 27 year old Caucasian female. Admitted to [facility] from the [facility] ED with complaints of opioid/benzodiazepine dependence, including suspected drug overdose. Patient reports, "I had asked my mother-in law to drop me off at  the hospital yesterday. I had 3 surgeries in 3 years and was prescribed large amount of percocet tablets for pain and Xanax pills for anxiety. When I was ready to come off of these drugs, I felt horrible, like I was going to die. I believe that I'm probably addicted to these pills. As of yesterday, I realized that I want to stop using, but I will need help including medical attention to achieve this goal of absolute sobreity. 1 week ago today, I had withdrawal seizures while in jail for breaking and entering. I was put in jail for failure to report a crime. I had seen someone broke into a home. I did not know this person and or what he looked like. I called 911, but could not identify who the burglar was. The cops decided to charge me with that crime and put me in jail instead. I have tried about 1 year ago to seek treatment for substance abuse. I was at the [facility], and was 7 months sober afterwards. But one of my grand parents died, that was what led to my relapse. I feel very uncomfortable at this time. I feel hot, sweaty, crampy and restless. I have hx of depression dating 13 years back. I take Wellbutrin and Fluoxetine".   Upon admission into this hospital, and after admission assessment/evaluation, it was determined that patient has polysubstance abuse problems, and will need detoxification treatment to stabilize her system of drug intoxication and to combat the withdrawal symptoms of these substances as well. And her discharge plans included a referral  to a long term treatment facility for a more intense substance abuse treatment. Rachel Mcdaniel was then started on Librium/clonidine protocols for her both opioid and benzodiazepine detoxification treatment. She was also enrolled in group counseling sessions and activities where she was counseled and taught/learned coping skills that should help her after discharge to cope better, manage her substance abuse problems to maintain a much longer sobriety. She  attended and participated in the AA/NA meetings being offered and held on this unit. She has some other previously existing and or identifiable medical conditions that required treatment and or monitoring. She received medication management for all those health issues as well. She was monitored closely for any potential problems that may arise as a result of and or during detoxification treatment. Patient tolerated her treatment regimen and detoxification treatment without any significant adverse effects and or reactions presented.  Besides detoxification treatment protocol, Rachel Mcdaniel also was ordered and received Gabapentin 300 mg for anxiety/pain symptoms, Viibryd 10 mg for depression and Trazodone 100 mg for sleep.  Patient attended treatment team meeting this am and met with the team. Her reason for admission, present symptoms, substance abuse issues, response to treatment and discharge plans discussed. Patient endorsed that she is doing well and stable for discharge to pursue the next phase of her substance abuse treatment. It was then decided and agreed upon that she will [facility]. For the meantime, Rachel Mcdaniel is being discharged to her home with family and [facility] in about 1 week. The address, date, time and contact information for this treatment facility provided for patient in writing.  She was encouraged to join/attend AA/NA meetings being offered and held within her community. She is instructed and encouraged to get a trusted sponsor from the advise of others or from whomever within the AA meetings seems to make sense, and who has a proven track record, and will hold her responsible for her sobriety, and both expects and insists on his total abstinence from drug use.   It was agreed upon between patient and the team that he will be discharged to her with family, will resume further substance abuse treatment/care [facility] on 09[facility]. Upon discharge, patient adamantly denies suicidal, homicidal ideations, auditory, visual hallucinations, delusional thinking and or withdrawal symptoms. Patient left [facility] with all personal belongings in no apparent distress. She received 2 weeks worth samples of her [facility] discharge medications. Transportation per family.   Consults:  psychiatry  Significant Diagnostic Studies:  labs: CBC with diff, CMP, UDS, Toxicology tests, U/A  Discharge Vitals:   Blood pressure 109/77, pulse 92, temperature 98 F (36.7 C), temperature source Oral, resp. rate 18, height 5\' 5"  (1.651 m), weight 82.555 kg (182 lb), last menstrual period 04/15/2011, SpO2 100.00%. Body mass index is 30.29 kg/(m^2). Lab Results:   No results found for this or any previous visit (from the past 72 hour(s)).  Physical Findings: AIMS: Facial and Oral Movements Muscles of Facial Expression: None, normal Lips and Perioral Area: None, normal Jaw: None, normal Tongue: None, normal,Extremity Movements Upper (arms, wrists, hands, fingers): None, normal Lower (legs, knees, ankles, toes): None, normal, Trunk Movements Neck, shoulders, hips: None, normal, Overall Severity Severity of abnormal movements (highest score from questions above): None, normal Incapacitation due to abnormal movements: None, normal Patient's awareness of abnormal movements (rate only patient's report): No Awareness, Dental Status Current problems with teeth and/or dentures?: No Does patient usually wear dentures?: No  CIWA:  CIWA-Ar Total: 0 COWS:  COWS Total Score: 0  Psychiatric Specialty Exam: See Psychiatric Specialty Exam and Suicide Risk Assessment completed by Attending Physician prior to discharge.  Discharge destination:  Home, then [facility] on 09/25/12  Is patient on multiple antipsychotic therapies at discharge:  No   Has Patient had three or more failed trials of antipsychotic monotherapy by history:   No  Recommended Plan for Multiple Antipsychotic Therapies: NA     Medication List    STOP taking these medications       buPROPion 75 MG tablet  Commonly known as:  WELLBUTRIN     FLUoxetine 40 MG capsule  Commonly known as:  PROZAC     oxyCODONE-acetaminophen 5-325 MG per tablet  Commonly known as:  PERCOCET/ROXICET      TAKE these medications     Indication   cyanocobalamin 1000 MCG/ML injection  Commonly known as:  (VITAMIN B-12)  Inject 1 mL (1,000 mcg total) into the muscle every 30 (thirty) days. For vitamin B-12 replacement   Indication:  Inadequate Vitamin B12     gabapentin 300 MG capsule  Commonly known as:  NEURONTIN  Take 1 capsule (300 mg total) by mouth 3 (three) times daily. For anxiety/pain control   Indication:  Agitation, Neuropathic Pain, Anxiety     traZODone 100 MG tablet  Commonly known as:  DESYREL  Take 1 tablet (100 mg total) by mouth at bedtime. For sleep   Indication:  Trouble Sleeping     Vilazodone HCl 10 MG Tabs  Commonly known as:  VIIBRYD  Take 1 tablet (10 mg total) by mouth daily. For depression   Indication:  Major Depressive Disorder       Follow-up Information   [facility] up with [facility] On 09/25/2012. (Call to schedule assessment time)    Contact information:   [address]  [place], [place] [address] Phone: [phone] Fax: [phone]     Follow-up recommendations:  Activity:  As tolerated Diet: As recommended by your primary care doctor. Keep all scheduled follow-up appointments as recommended.  Comments:  Take all your medications as prescribed by your mental healthcare provider. Report any adverse effects and or reactions from your medicines to your outpatient provider promptly. Patient is instructed and cautioned to not engage in alcohol and or illegal drug use while on prescription medicines. In the event of worsening symptoms, patient is instructed to call the crisis hotline, 911 and or go to the nearest ED for  appropriate evaluation and treatment of symptoms. Follow-up with your primary care provider for your other medical issues, concerns and or health care needs.   Total Discharge Time:  Greater than 30 minutes.  Signed: Mikal Plane I, PMHNP-BC 09/19/2012, 9:07 AM I am agreed with the findings and involve in the treatment plan.

## 2012-09-18 NOTE — Progress Notes (Signed)
D: Pt denies SI/HI/AVH.  She reports her financial arrangements for leaving the facility have not transpired as she expected; she expressed concerns over this.  She reports sleeping well, good appetite and a good energy level.  Pt continues to complain of abd pain.  A: Pt was given support and encouragement. Scheduled medications were given.  Pt was encouraged to attend groups.  Checks were done Q 15 minutes to maintain safety.  R: Pt attends and participates in groups.  She interacts well with staff and her peers.  Pt is taking her scheduled medications.  Pt is receptive to treatment and her safety has been maintained.

## 2012-09-19 DIAGNOSIS — F1320 Sedative, hypnotic or anxiolytic dependence, uncomplicated: Secondary | ICD-10-CM

## 2012-09-19 DIAGNOSIS — F1120 Opioid dependence, uncomplicated: Principal | ICD-10-CM

## 2012-09-19 NOTE — Progress Notes (Signed)
Pt discharged to mother at this time. Pt denies SI/HI. Nicotine patch removed. Discharge instructions provided and pt verbalized understanding. All belongings returned. Prescriptions provided as well as supply of medications.

## 2012-09-19 NOTE — Progress Notes (Signed)
Psychoeducational Group Note  Date:  09/19/2012 Time:  0945 am  Group Topic/Focus:  Making Healthy Choices:   The focus of this group is to help patients identify negative/unhealthy choices they were using prior to admission and identify positive/healthier coping strategies to replace them upon discharge.  Participation Level:  Did Not Attend  [name] J 09/19/2012, 10:29 AM 

## 2012-09-19 NOTE — Progress Notes (Signed)
Psychoeducational Group Note  Date:  09/19/2012 Time:  0945 am  Group Topic/Focus:  Making Healthy Choices:   The focus of this group is to help patients identify negative/unhealthy choices they were using prior to admission and identify positive/healthier coping strategies to replace them upon discharge.  Participation Level:  Active  Participation Quality:  Appropriate and Attentive  Affect:  Appropriate  Cognitive:  Alert and Appropriate  Insight:  Engaged  Engagement in Group:  Engaged  Additional Comments:    Rachel Mcdaniel 09/19/2012, 10:29 AM

## 2012-09-19 NOTE — Progress Notes (Signed)
Psychoeducational Group Note  Date:  09/18/2012 Time: 2100  Group Topic/Focus:  wrap up group  Participation Level: Did Not Attend  Participation Quality:  Not Applicable  Affect:  Not Applicable  Cognitive:  Not Applicable  Insight:  Not Applicable  Engagement in Group: Not Applicable  Additional Comments:  Pt remained in her room writing in her journal during group.  Shelah Lewandowsky 09/19/2012, 4:49 AM

## 2012-09-19 NOTE — [facility] Group Notes (Signed)
[facility] Group Notes: (Clinical Social Work)   09/19/2012      Type of Therapy:  Group Therapy   Participation Level:  Did Not Attend    Ambrose Mantle, LCSW 09/19/2012, 1:29 PM

## 2012-09-20 NOTE — Progress Notes (Signed)
Patient Discharge Instructions:  After Visit Summary (AVS):   Faxed to:  09/20/12 Psychiatric Admission Assessment Note:   Faxed to:  09/20/12 Suicide Risk Assessment - Discharge Assessment:   Faxed to:  09/20/12 Faxed/Sent to the Next Level Care provider:  09/20/12 Faxed to [facility] @ [phone]  Jerelene Redden, 09/20/2012, 4:15 PM

## 2012-09-27 IMAGING — US US TRANSVAGINAL NON-OB
1 series · 13 of 25 positions shown · non-contrast
Comparison: Ultrasound 12/19/2009

CLINICAL DATA: Right lower quadrant pain, bleeding for 4 weeks.
History of cyst removed.  History of endometriosis, ovarian cyst.



[Series 1: us transvaginal non-ob · 0.12mm/px · 13 of 39 slices shown]
[im 1/39]
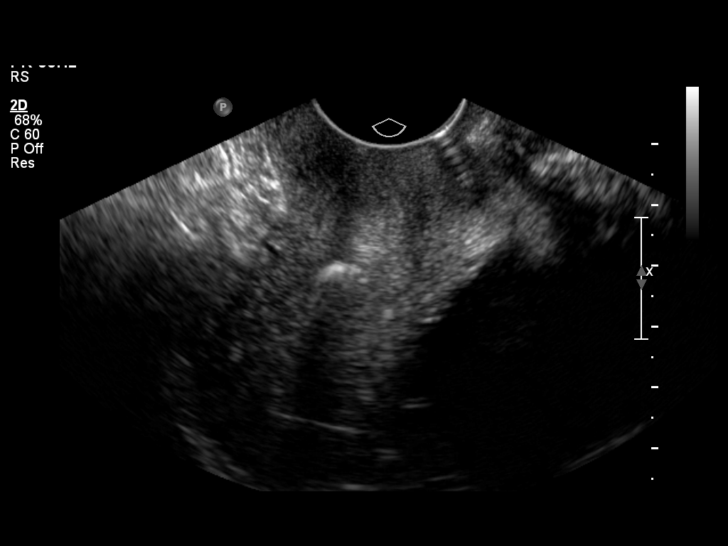
[im 4/39]
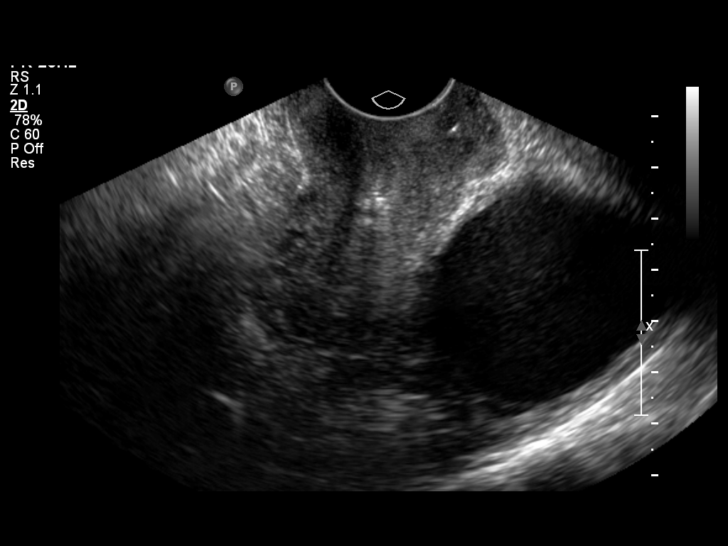
[im 7/39]
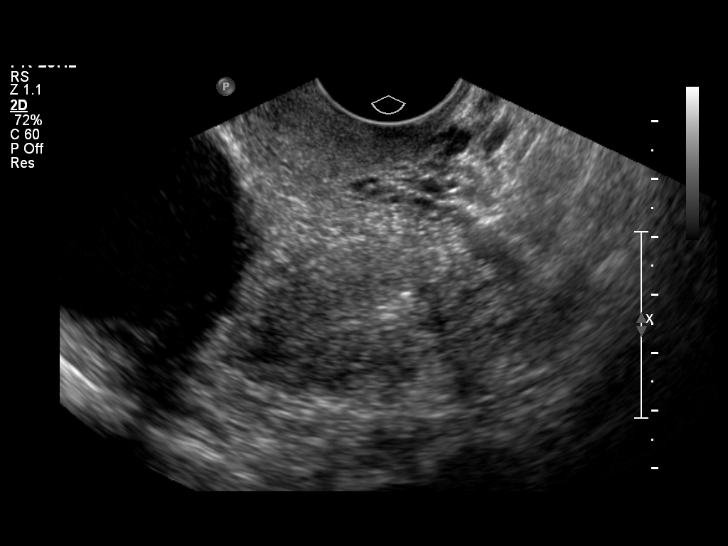
[im 10/39]
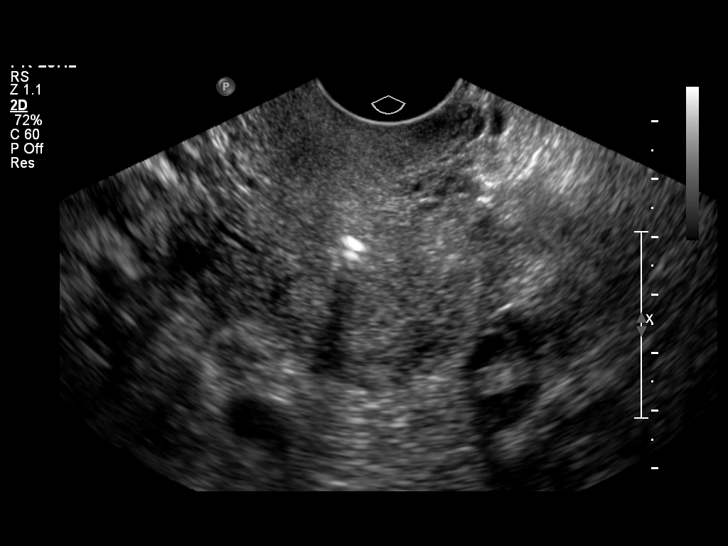
[im 13/39]
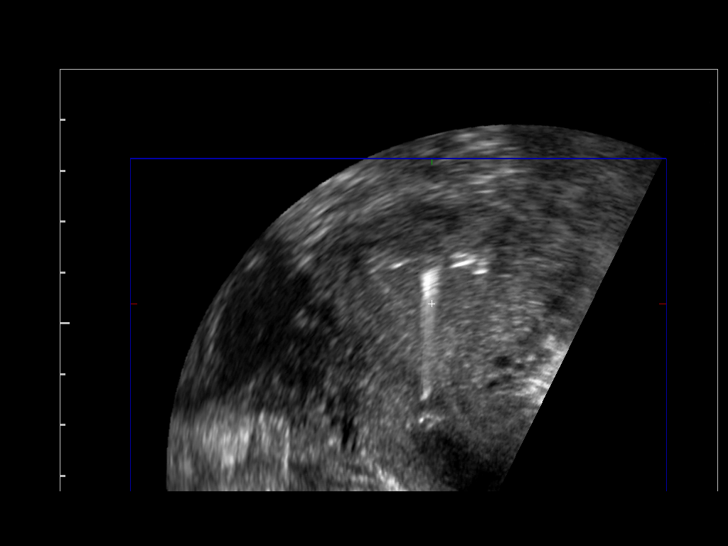
[im 16/39]
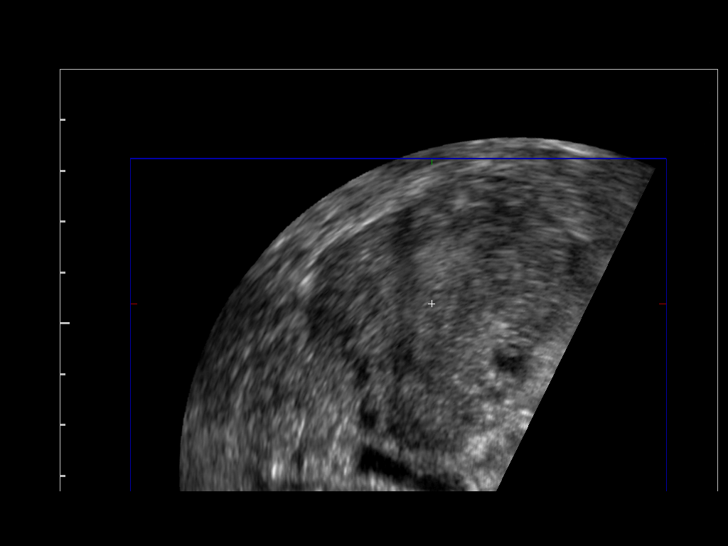
[im 20/39]
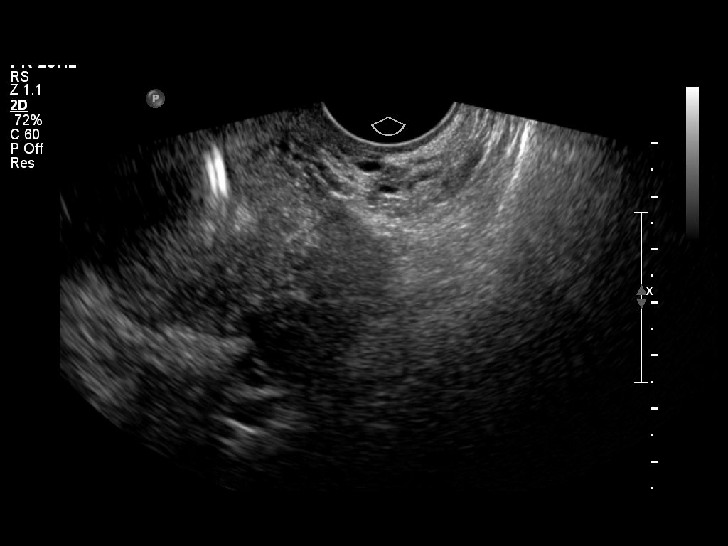
[im 23/39]
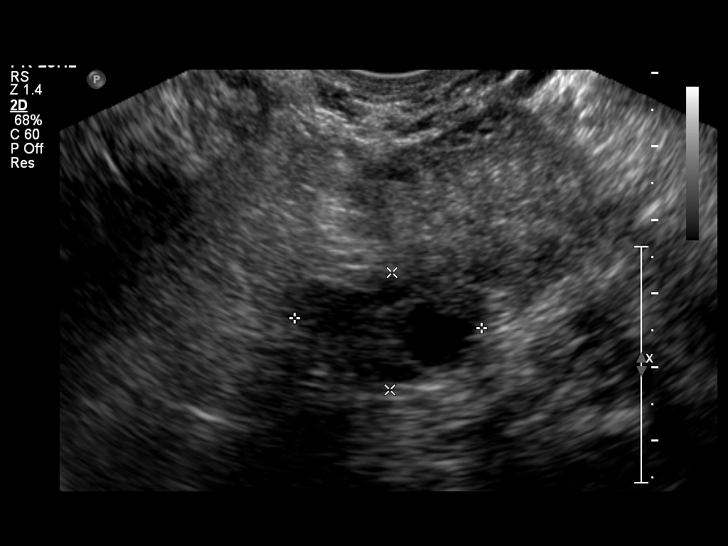
[im 26/39]
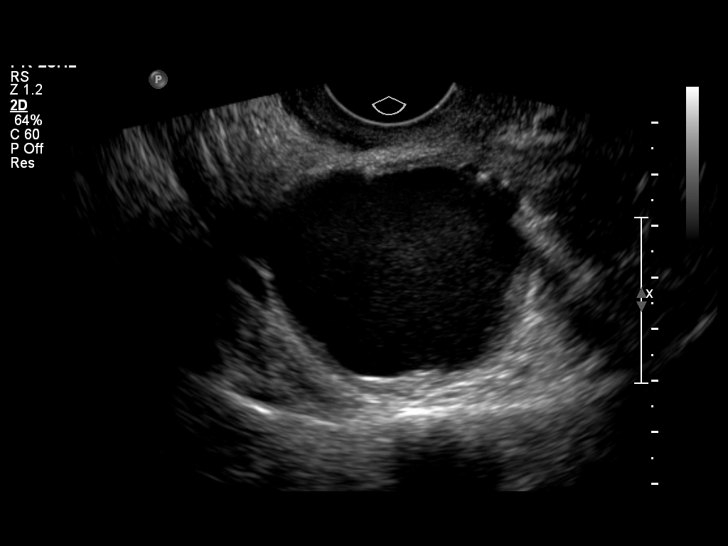
[im 29/39]
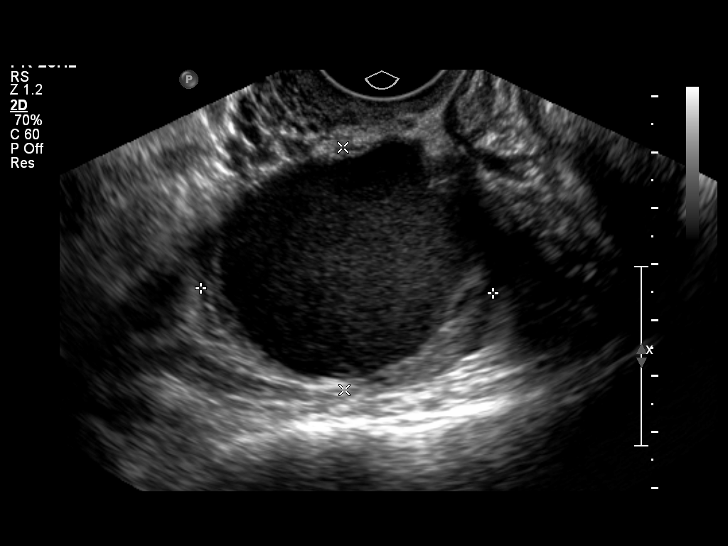
[im 32/39]
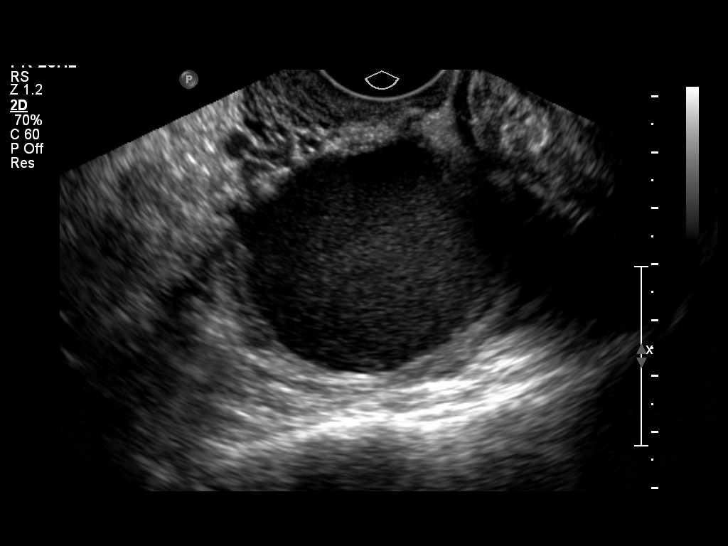
[im 35/39]
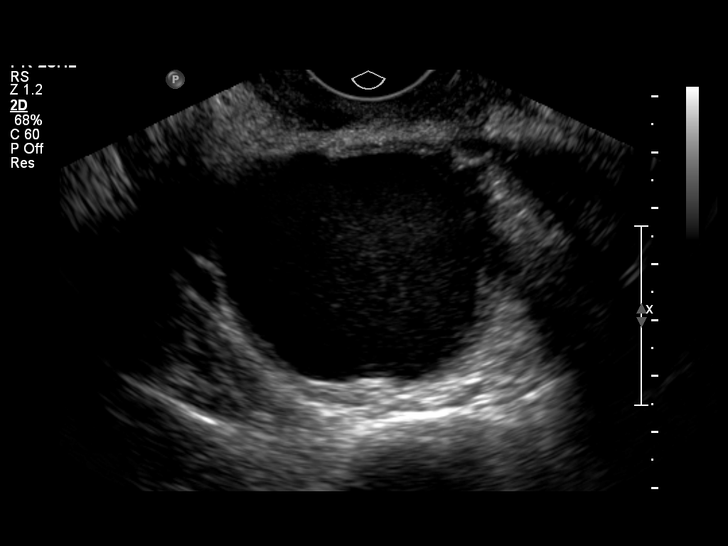
[im 39/39]
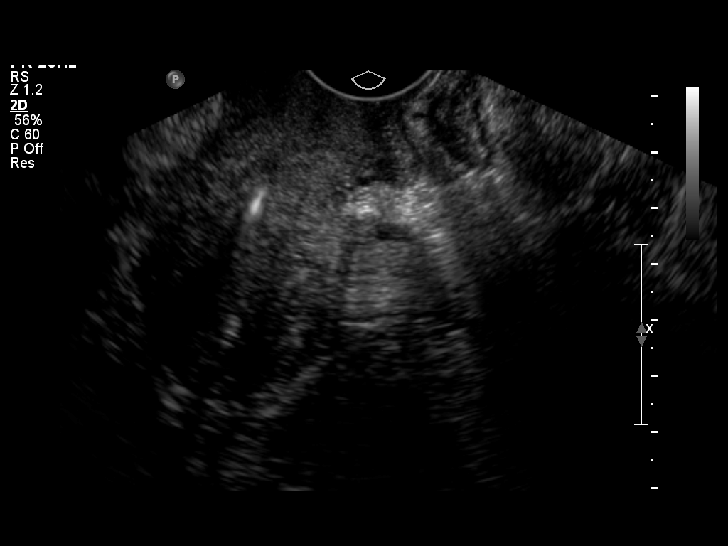

[13 of 25 positions shown; findings below may reference images not displayed]

FINDINGS: Uterus the uterus is retroverted and contains intrauterine device.
Uterine measurements are not performed on this endovaginal only
exam.

Endometrium endometrium measures 6.5 mm.

Right Ovary the right ovary is 5.2 x 4.3 x 5.3 cm.  Hypoechoic
circumscribed structure within the right ovary is 4.4 x 3.8 x
cm.  Within this structure, there are diffuse low level echoes,
consistent with an endometrioma.  Differential diagnosis also
includes a hemorrhagic cyst.  The appearance is stable.

Left Ovary measures 2.5 x 1.6 x 1.9 cm and has a normal appearance.

Other Findings:  There is no free pelvic fluid.
IMPRESSION: Stable hypoechoic nodule within the right ovary, consistent with an
endometrioma versus hemorrhagic cyst.

## 2012-10-14 NOTE — ED Provider Notes (Signed)
History/physical exam/procedure(s) were performed by non-physician practitioner and as supervising physician I was immediately available for consultation/collaboration. I have reviewed all notes and am in agreement with care and plan.   Hilario Quarry, MD 10/14/12 1116

## 2012-11-10 IMAGING — US US TRANSVAGINAL NON-OB
1 series · 13 of 25 positions shown · non-contrast
Comparison: 02/04/2010 and 12/19/2009

CLINICAL DATA: Severe right pelvic pain.  Negative pregnancy test.
Endometriosis.

TRANSVAGINAL ULTRASOUND OF PELVIS
TECHNIQUE: Transvaginal ultrasound examination of the pelvis was
performed including evaluation of the uterus, ovaries, adnexal
regions, and pelvic cul-de-sac.

[Series 1: us transvaginal non-ob · 13 of 46 slices shown]
[im 1/46]
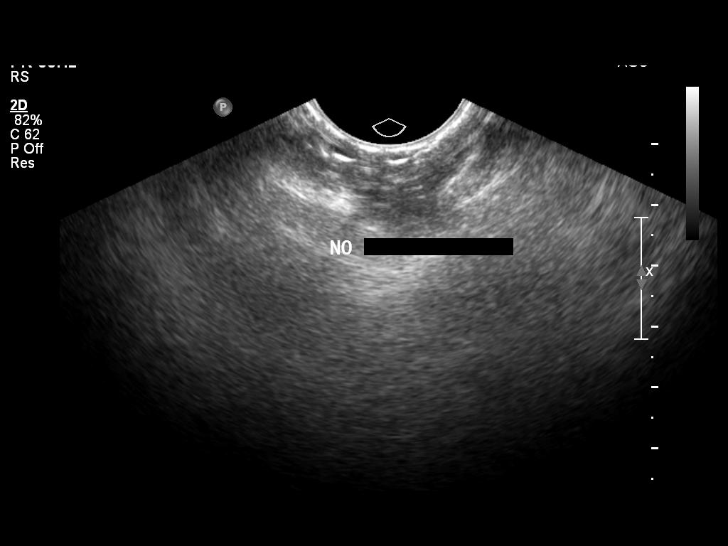
[im 4/46]
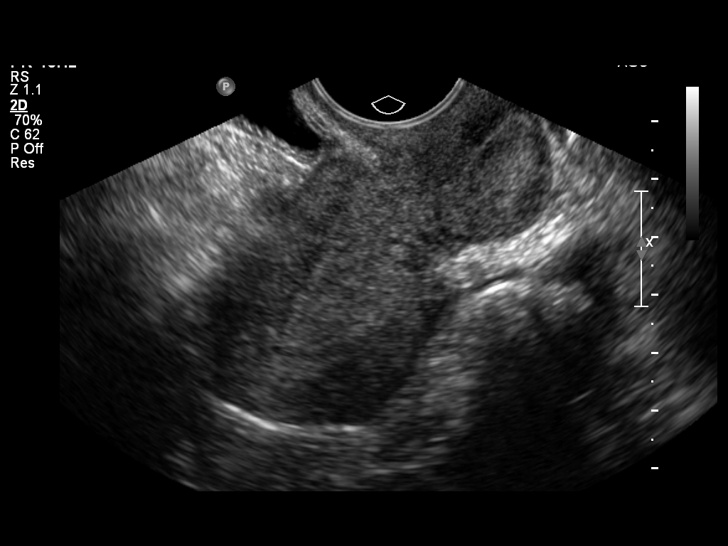
[im 8/46]
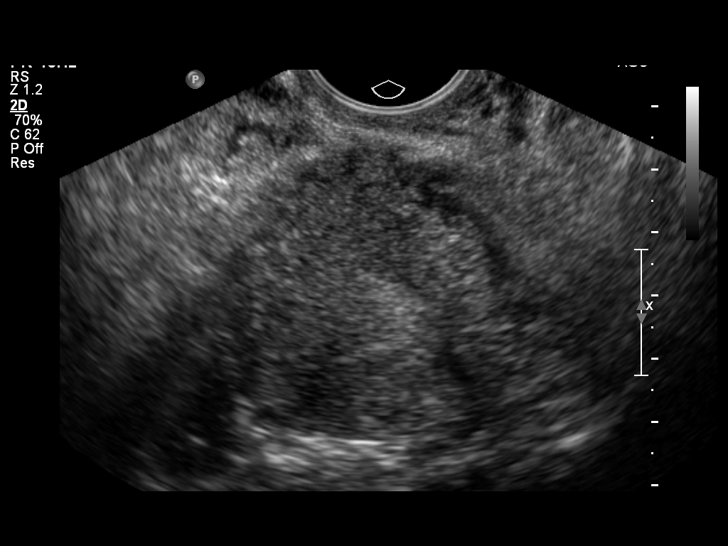
[im 12/46]
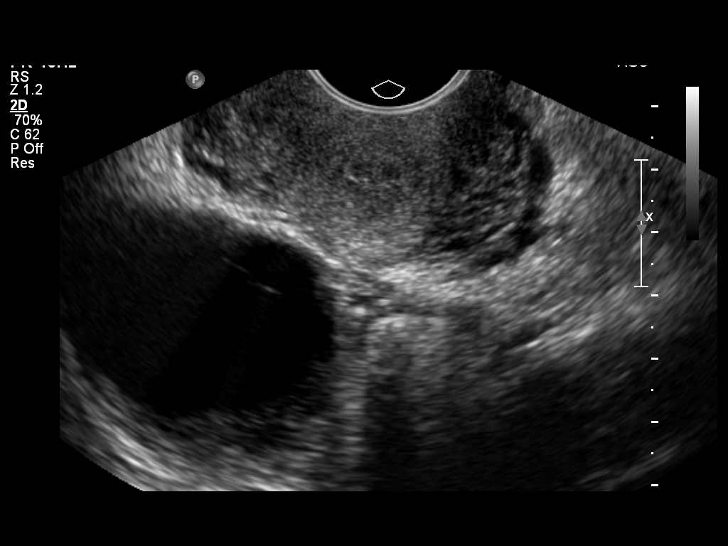
[im 16/46]
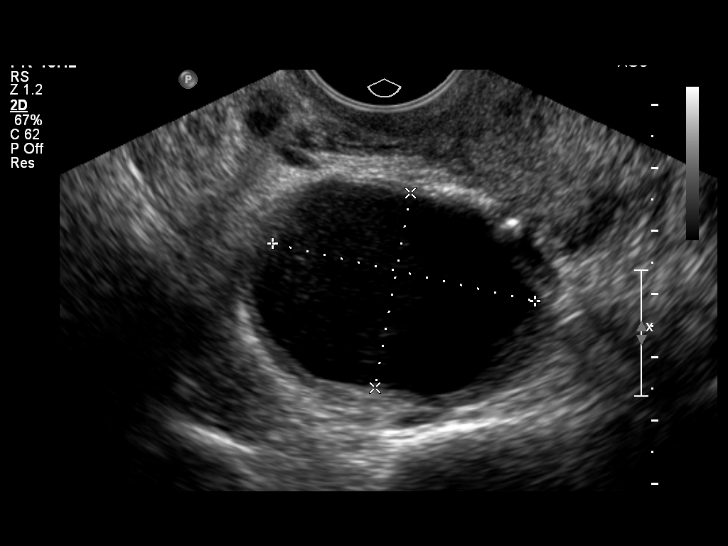
[im 19/46]
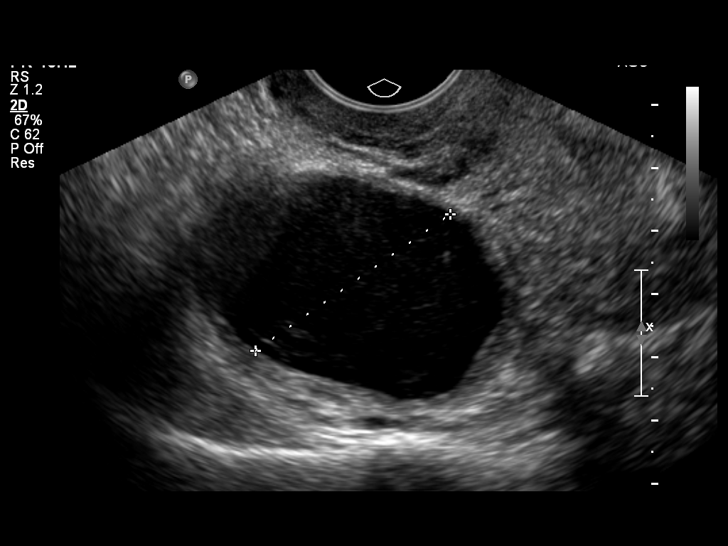
[im 23/46]
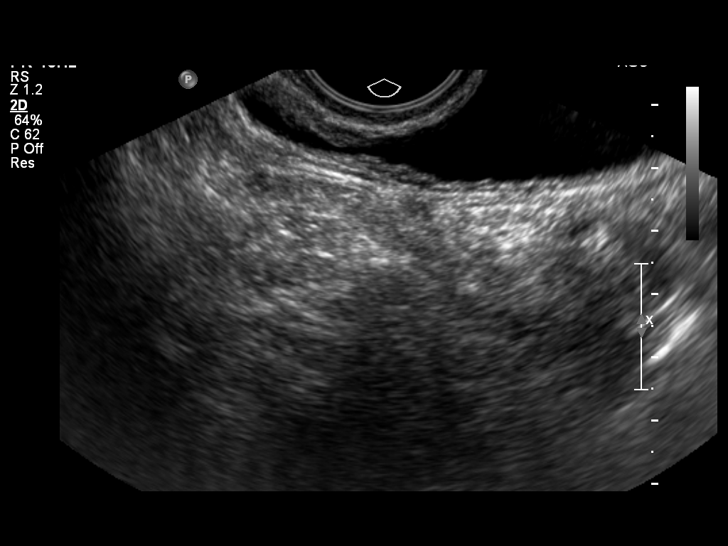
[im 27/46]
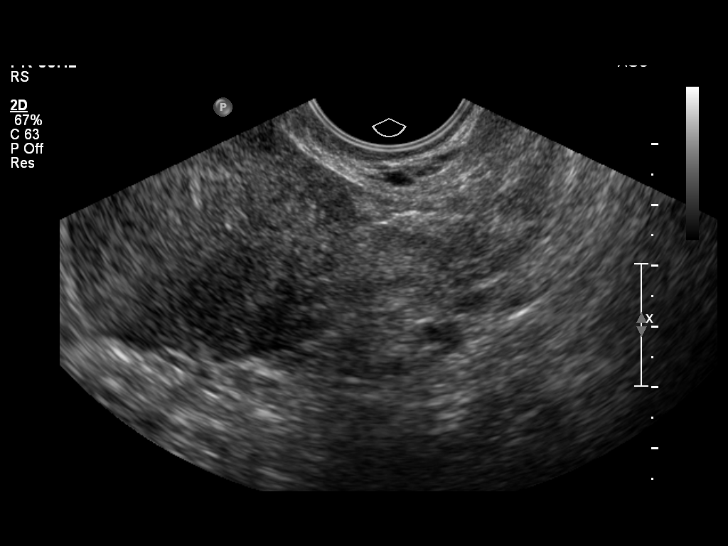
[im 31/46]
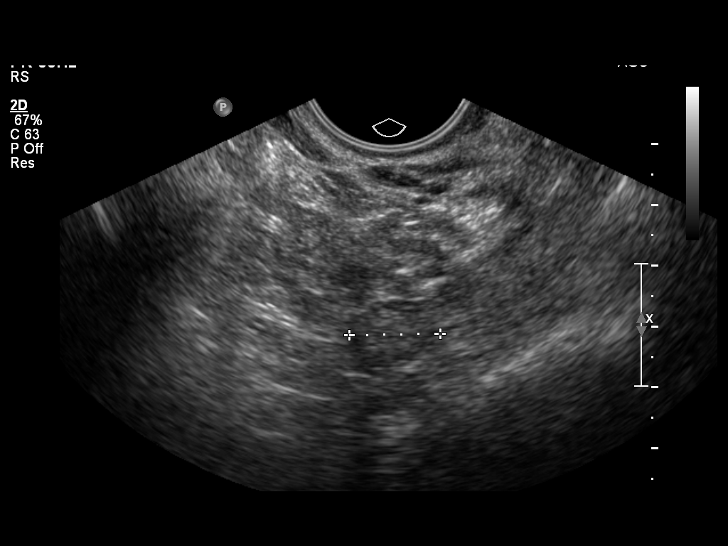
[im 34/46]
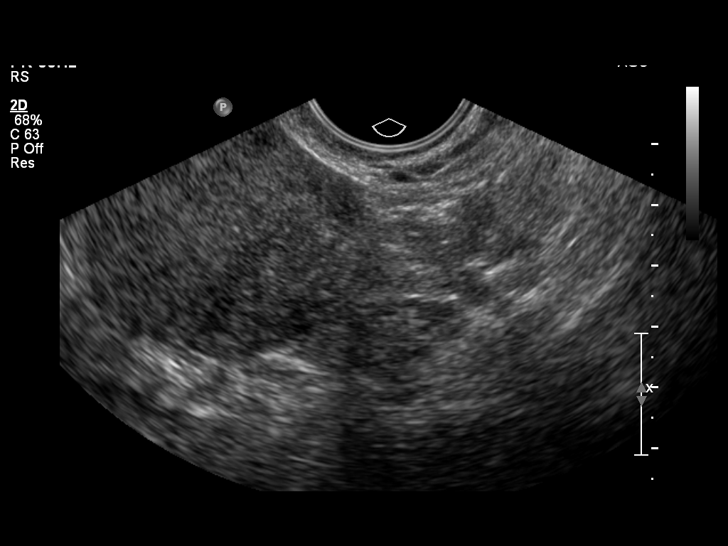
[im 38/46]
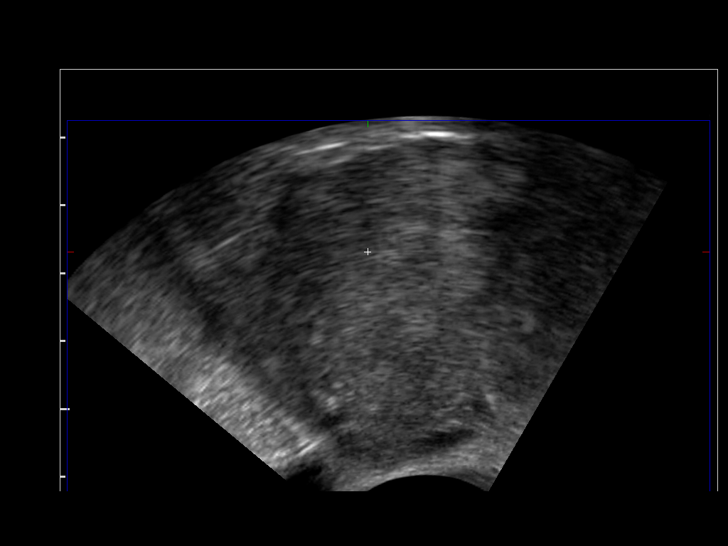
[im 42/46]
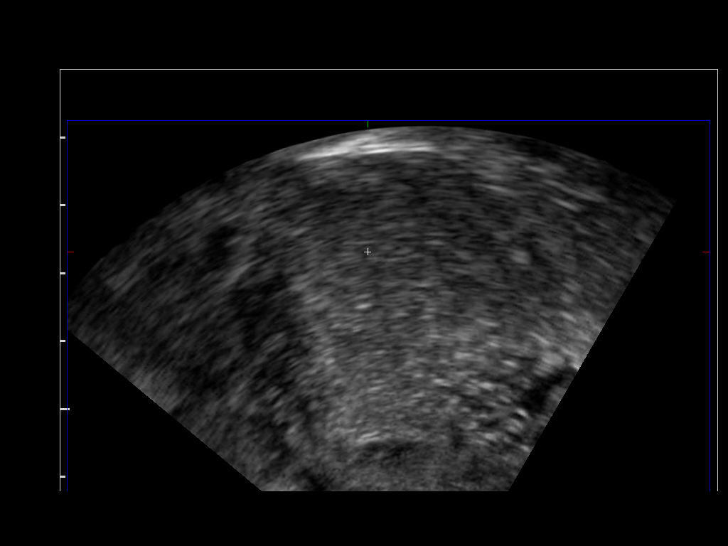
[im 46/46]
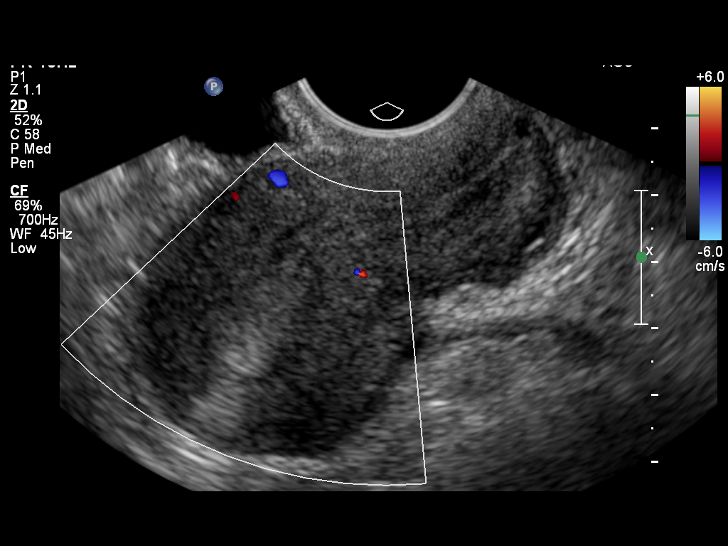

[13 of 25 positions shown; findings below may reference images not displayed]

FINDINGS: Uterus measures 7.2 x 4.1 x 5.2 cm.  No fibroids or other uterine
masses identified.

Endometrium measures 6 mm in thickness.  Within normal limits in
appearance.

Right Ovary is not well visualized.  A complex cyst is seen which
involves the ovary, and contains diffuse low level internal echoes
and a few thin septations.  No definite internal blood flow is seen
on color Doppler ultrasound.  This measures 4.9 x 3.1 x 4.5 cm, and
has not simply changed in size or appearance since previous study.
This is a most consistent with an endometrioma involving the ovary.

Left Ovary measures 2.4 x 1.1 x 1.5 cm.  Normal appearance.

Other Findings:  No other abnormality identified.
IMPRESSION: 1.  Stable 5 cm complex cystic lesion in the right adnexa involving
the ovary, which has benign characteristics and is consistent with
an endometrioma.  If not surgically removed, annual follow-up by
ultrasound is recommended.
2.  Normal appearance of uterus and left ovary.

This recommendation follows the consensus statement:  Management of
Asymptomatic Ovarian and Other Adnexal Cysts Imaged at US:  Society
of Radiologists in Ultrasound Consensus Conference Statement.
[URL]

## 2013-01-13 ENCOUNTER — Encounter: Payer: Self-pay | Admitting: Family Medicine

## 2013-01-13 ENCOUNTER — Ambulatory Visit (INDEPENDENT_AMBULATORY_CARE_PROVIDER_SITE_OTHER): Payer: BC Managed Care – PPO | Admitting: Family Medicine

## 2013-01-13 VITALS — BP 124/80 | HR 90 | Temp 99.0°F | Resp 16 | Ht 65.0 in | Wt 195.0 lb

## 2013-01-13 DIAGNOSIS — F329 Major depressive disorder, single episode, unspecified: Secondary | ICD-10-CM

## 2013-01-13 DIAGNOSIS — F1320 Sedative, hypnotic or anxiolytic dependence, uncomplicated: Secondary | ICD-10-CM

## 2013-01-13 DIAGNOSIS — F4001 Agoraphobia with panic disorder: Secondary | ICD-10-CM

## 2013-01-13 DIAGNOSIS — F321 Major depressive disorder, single episode, moderate: Secondary | ICD-10-CM

## 2013-01-13 DIAGNOSIS — F411 Generalized anxiety disorder: Secondary | ICD-10-CM

## 2013-01-13 DIAGNOSIS — F3289 Other specified depressive episodes: Secondary | ICD-10-CM

## 2013-01-13 NOTE — Patient Instructions (Signed)
It is not safe to restart controlled medications unless you are under the guidance of a psychiatrist.  If you cannot get into outpatient psychiatric care, you need to go to [facility] - even if you need to bring your daughter with you.  I am concerned about your recent admission to behavioral health for narcotic and benzodiazepine addiction and withdrawal and now you have restarted on phentermine, zolpidem, and hydrocodone which could all make your problems much worse.  We will continue trying to get a hold of your psychiatrist Dr. Dub Mikes to find out if they have any recommendations in your next treatment steps that we can start on now.

## 2013-01-13 NOTE — Progress Notes (Signed)
Subjective:    Patient ID: Rachel Mcdaniel, female    DOB: 09/08/1985, 27 y.o.   MRN: [id number] Chief Complaint  Patient presents with  . Insomnia     1 week + had a pania attack this a.m. going through a divorce    HPI  Feels like life is falling apart and after she came home last week there was another woman in the house so now she is moving out. The ambien isn't keeping her asleep - will only sleep for about 2 hours.  Getting medication from the " general medical clinic" next door - the one on the corner of - they were closed until 2:30 and she has to pick up her daughter before then.  Everything got better so she stopped going to psychiatry - saw Dr. Dub Mikes a few times (didn't remember his name.)  Has had some trazodone as well but just feels like nothing helps her sleep.  Tries to stay away from caffiene and doesn't know whether then insomnia is causing panic attacks or vice versa.  Doesn't want to retry trazodone.  Past Medical History  Diagnosis Date  . 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 Tobacco abuse   . Endometriosis   . HTN (hypertension)     Hx PIH w/ preg; now borderline - no meds  . Anemia     history  . Anxiety   . Headache(784.0)     OTC meds prn  . Depression     History - no meds  . Neuromuscular disorder    Current Outpatient Prescriptions on File Prior to Visit  Medication Sig Dispense Refill  . cyanocobalamin (,VITAMIN B-12,) 1000 MCG/ML injection Inject 1 mL (1,000 mcg total) into the muscle every 30 (thirty) days. For vitamin B-12 replacement  1 mL  0   No current facility-administered medications on file prior to visit.   No Known Allergies    Review of Systems  Psychiatric/Behavioral: Positive for behavioral problems, sleep disturbance, dysphoric mood, decreased concentration and agitation. Negative for suicidal ideas. The patient is nervous/anxious.       BP 124/80  Pulse 90  Temp(Src) 99 F (37.2 C) (Oral)  Resp 16  Ht 5\' 5"  (1.651 m)   Wt 195 lb (88.451 kg)  BMI 32.45 kg/m2  SpO2 100%  LMP 04/15/2011 Objective:   Physical Exam  Constitutional: She is oriented to person, place, and time. She appears well-developed and well-nourished. No distress.  HENT:  Head: Normocephalic and atraumatic.  Right Ear: External ear normal.  Eyes: Conjunctivae are normal. No scleral icterus.  Pulmonary/Chest: Effort normal.  Neurological: She is alert and oriented to person, place, and time.  Skin: Skin is warm and dry. She is not diaphoretic. No erythema.  Psychiatric: She has a normal mood and affect. Her behavior is normal.          Assessment & Plan:  Benzodiazepine dependence  DEPRESSION, MAJOR, MODERATE  PANIC DISORDER WITH AGORAPHOBIA  Very concerned after chart review on [facility] review that pt restarted multiple controlled drugs- benzos, opiates, and weight loss stimulant after recent beh health hosp for the express concern of getting her off of controlled substances.  Very concerned about dr shopping - she is being seen at a different clinic next door but couldn't wait an additional hr until they opened so came here instead. Informed pt that giving her more benzos - esp considering she was recently weaned off of controlled substances during behavioral health hosp -  clearly doesn't seem to be working. Pt states she has to have something to help w/ sxs so I told pt that I was going to contact her psychiatrist - Dr. Dub Mikes - to see how we could adjust pt's medication regimen.  While I was out of the exam room trying to get in touch w/ Dr. Dub Mikes pt just left the clinic w/o informing anyone - so left AMA.   Norberto Sorenson, MD MPH

## 2013-04-20 ENCOUNTER — Emergency Department (HOSPITAL_COMMUNITY)
Admission: EM | Admit: 2013-04-20 | Discharge: 2013-04-20 | Discharge status: Against Medical Advice | Payer: BC Managed Care – PPO

## 2013-04-20 NOTE — ED Notes (Signed)
Pt called x3 with no response in waiting room.

## 2013-06-06 ENCOUNTER — Encounter (HOSPITAL_COMMUNITY): Payer: Self-pay | Admitting: Emergency Medicine

## 2013-06-06 ENCOUNTER — Emergency Department (HOSPITAL_COMMUNITY): Payer: BC Managed Care – PPO

## 2013-06-06 ENCOUNTER — Emergency Department (HOSPITAL_COMMUNITY)
Admission: EM | Admit: 2013-06-06 | Discharge: 2013-06-06 | Disposition: A | Discharge status: Home / Self Care | Payer: BC Managed Care – PPO | Attending: Emergency Medicine | Admitting: Emergency Medicine

## 2013-06-06 DIAGNOSIS — IMO0002 Reserved for concepts with insufficient information to code with codable children: Secondary | ICD-10-CM | POA: Insufficient documentation

## 2013-06-06 DIAGNOSIS — F329 Major depressive disorder, single episode, unspecified: Secondary | ICD-10-CM | POA: Insufficient documentation

## 2013-06-06 DIAGNOSIS — Z8742 Personal history of other diseases of the female genital tract: Secondary | ICD-10-CM | POA: Insufficient documentation

## 2013-06-06 DIAGNOSIS — F411 Generalized anxiety disorder: Secondary | ICD-10-CM | POA: Insufficient documentation

## 2013-06-06 DIAGNOSIS — G8929 Other chronic pain: Secondary | ICD-10-CM | POA: Insufficient documentation

## 2013-06-06 DIAGNOSIS — Y92009 Unspecified place in unspecified non-institutional (private) residence as the place of occurrence of the external cause: Secondary | ICD-10-CM | POA: Insufficient documentation

## 2013-06-06 DIAGNOSIS — M545 Low back pain, unspecified: Secondary | ICD-10-CM

## 2013-06-06 DIAGNOSIS — Z87891 Personal history of nicotine dependence: Secondary | ICD-10-CM | POA: Insufficient documentation

## 2013-06-06 DIAGNOSIS — Y9301 Activity, walking, marching and hiking: Secondary | ICD-10-CM | POA: Insufficient documentation

## 2013-06-06 DIAGNOSIS — F3289 Other specified depressive episodes: Secondary | ICD-10-CM | POA: Insufficient documentation

## 2013-06-06 DIAGNOSIS — R269 Unspecified abnormalities of gait and mobility: Secondary | ICD-10-CM | POA: Insufficient documentation

## 2013-06-06 DIAGNOSIS — Z862 Personal history of diseases of the blood and blood-forming organs and certain disorders involving the immune mechanism: Secondary | ICD-10-CM | POA: Insufficient documentation

## 2013-06-06 DIAGNOSIS — I10 Essential (primary) hypertension: Secondary | ICD-10-CM | POA: Insufficient documentation

## 2013-06-06 DIAGNOSIS — W108XXA Fall (on) (from) other stairs and steps, initial encounter: Secondary | ICD-10-CM | POA: Insufficient documentation

## 2013-06-06 DIAGNOSIS — W19XXXA Unspecified fall, initial encounter: Secondary | ICD-10-CM

## 2013-06-06 MED ORDER — IBUPROFEN 800 MG PO TABS: 800.0000 mg | ORAL_TABLET | Freq: Three times a day (TID) | ORAL | Status: DC

## 2013-06-06 MED ORDER — KETOROLAC TROMETHAMINE 30 MG/ML IJ SOLN
30.0000 mg | Freq: Once | INTRAMUSCULAR | Status: AC
  Administered 2013-06-06: 30 mg via INTRAMUSCULAR
  Filled 2013-06-06: qty 1

## 2013-06-06 MED ORDER — HYDROCODONE-ACETAMINOPHEN 5-325 MG PO TABS: 1.0000 | ORAL_TABLET | ORAL | Status: DC | PRN

## 2013-06-06 MED ORDER — HYDROCODONE-ACETAMINOPHEN 5-325 MG PO TABS
1.0000 | ORAL_TABLET | Freq: Once | ORAL | Status: AC
  Administered 2013-06-06: 1 via ORAL
  Filled 2013-06-06: qty 1

## 2013-06-06 MED ORDER — CYCLOBENZAPRINE HCL 10 MG PO TABS: 10.0000 mg | ORAL_TABLET | Freq: Every day | ORAL | Status: DC

## 2013-06-06 NOTE — ED Notes (Signed)
Returned from radiology, daughter on stretcher with pt.

## 2013-06-06 NOTE — ED Notes (Signed)
Per pt sts back injury 1 month ago and she fell down some stairs today and re injured. sts severe lower back pain.

## 2013-06-06 NOTE — ED Notes (Signed)
Patient transported to X-ray, young daughter accompanying.

## 2013-06-06 NOTE — Discharge Instructions (Signed)
Call for a follow up appointment with a Family or Primary Care Provider.  Return if Symptoms worsen.   Take medication as prescribed.  Ice your lower back 3-4 times a day for the first 48 hours.  You can then switch to ice and heat.    Emergency Department Resource Guide 1) Find a Doctor and Pay Out of Pocket Although you won't have to find out who is covered by your insurance plan, it is a good idea to ask around and get recommendations. You will then need to call the office and see if the doctor you have chosen will accept you as a new patient and what types of options they offer for patients who are self-pay. Some doctors offer discounts or will set up payment plans for their patients who do not have insurance, but you will need to ask so you aren't surprised when you get to your appointment.  2) Contact Your Local Health Department Not all health departments have doctors that can see patients for sick visits, but many do, so it is worth a call to see if yours does. If you don't know where your local health department is, you can check in your phone book. The CDC also has a tool to help you locate your state's health department, and many state websites also have listings of all of their local health departments.  3) Find a Walk-in Clinic If your illness is not likely to be very severe or complicated, you may want to try a walk in clinic. These are popping up all over the country in pharmacies, drugstores, and shopping centers. They're usually staffed by nurse practitioners or physician assistants that have been trained to treat common illnesses and complaints. They're usually fairly quick and inexpensive. However, if you have serious medical issues or chronic medical problems, these are probably not your best option.  No Primary Care Doctor: - Call Health Connect at  [phone] - they can help you locate a primary care doctor that  accepts your insurance, provides certain services,  etc. - Physician Referral Service- [phone]  Chronic Pain Problems: Organization         Address  Phone   Notes  [facility]  [phone] Patients need to be referred by their primary care doctor.   Medication Assistance: Organization         Address  Phone   Notes  [facility] Medication [facility] [address] [place] [place], [place] [address] [phone] --Must be a resident of [facility] -- Must have NO insurance coverage whatsoever (no Medicaid/ Medicare, etc.) -- The pt. MUST have a primary care doctor that directs their care regularly and follows them in the community   Med[facility]ist  [phone]   [employer]  [phone]    Agencies that provide inexpensive medical care: Organization         Address  Phone   Notes  [facility]  [phone]   [facility]    [phone]   [facility] [address] [place], [place] [address] [phone]   [facility] [address] [place] [phone]   [facility]anned Parenthood    [phone]   [facility]    [phone]   [facility]  [address], [place] Phone:  [phone], Fax:  [phone] Hours of Operation:  9 am -  6 pm, M-F.  Also accepts Medicaid/Medicare and self-pay.  [facility]  [address], [place] Phone: [phone], Fax: [phone]. Hours of Operation:  8:30 am - 5:30 pm, M-F.  Also accepts Medicaid and self-pay.  [facility] [place] [address], [place] Phone: [phone]   [facility] [address] Natasha Bence [place], [place] [phone], Ext. [phone] Mondays & Thursdays: 7-9 AM.  First 15 patients are seen on a first come, first serve basis.    Medicaid-accepting [facility] Providers:  Organization         Address  Phone   Notes  [facility] [address], [place] [phone] Also accepts self-pay patients.  [facility] [address] Laurell Josephs [place], [place]  [phone]   [facility] [address], [place] [phone]   [facility] [address], [place] [phone]   Renaye Rakers [address], [place]   [phone] Only accepts [place] patients after they have their name applied to their card.   Self-Pay (no insurance) in [facility]:  Organization         Address  Phone   Notes  Sickle Cell Patients, [facility] [address] [place], [place] [phone]   [facility] [address] [place], [place] [phone]   [facility] [place]  [address], [place] [phone]   [facility]/Dr. Osei-Bonsu  [address] [place] or [address], [place] [phone] Phone number for both [place] and [place] locations is the same.  [facility] [address] [place] [phone]   [facility] [address], [place] or [address] [phone]   [facility] [address] [place] [phone], phone; [phone], fax Sees patients 1st and 3rd Saturday of every month.  Must not qualify for public or private insurance (i.e. Medicaid, Medicare, [place] Health Choice, Veterans' Benefits)  Household income should be no more than 200% of the poverty level The clinic cannot treat you if you are pregnant or think you are pregnant  Sexually transmitted diseases are not treated at the clinic.    Dental Care: Organization         Address  Phone  Notes  [facility] Department of [facility] [address] [place], [place] [phone] Accepts children up to  age 5 who are enrolled in [place] or [place] Health Choice; pregnant women with a Medicaid card; and children who have applied for Medicaid or [place] Health Choice, but were declined, whose parents can pay a reduced fee at time of service.  [facility] Department of [facility]  [address], [place] [phone] Accepts children up to age 51 who are enrolled in [place] or [place] Health Choice; pregnant women with a Medicaid card; and children who have applied for Medicaid or  Health Choice, but were declined, whose parents can pay a reduced fee at time of service.  [facility]  [address] [place], [place] [phone] Patients are seen by appointment only. Walk-ins are not accepted. [facility] will see patients 21 years of age and  older. Monday - Tuesday (8am-5pm) Most Wednesdays (8:30-5pm) $30 per visit, cash only  [facility]  [address], [facility] [phone] Patients are seen by appointment only. Walk-ins are not accepted. [facility] will see patients 28 years of age and older. One Wednesday Evening (Monthly: Volunteer Based).  $30 per visit, cash only  [facility]  [phone] for adults; Children under age 584, call Graduate Pediatric Dentistry at [phone]. Children aged 104-14, please call [phone] to request a pediatric application.  Dental services are provided in all areas of dental care including fillings, crowns and bridges, complete and partial dentures, implants, gum treatment, root canals, and extractions. Preventive care is also provided. Treatment is provided to both adults and children. Patients are selected via a lottery and there is often a waiting list.   [facility] [address], [place]  [phone] [email or web address]   [facility] [address], [place], [place] [phone], Ext. [phone] Second and Fourth Thursday of  each month, opens at 6:30 AM; Clinic ends at 9 AM.  Patients are seen on a first-come first-served basis, and a limited number are seen during each clinic.   [facility]  [address], [place], [place] [phone]   Eligibility Requirements You must have lived in [place], [place], or [place] for at least the last three months.   You cannot be eligible for state or federal sponsored [place]healthcare insurance, including CIGNAVeterans Administration, [place]icaid, or [employer]icare.   You generally cannot be eligible for healthcare insurance through your employer.    How to apply: Eligibility screenings are held every Tuesday and Wednesday afternoon from 1:00 pm until 4:00 pm. You do not need an appointment for the interview!  [facility] [address], [place], [place] [phone]   [facility]  [phone]   [facility]  [phone]   [facility]  [phone]    Behavioral Health Resources in the Community: Intensive Outpatient Programs Organization         Address  Phone  Notes  [facility] [address], [place], [place] [phone]   [facility] [address], [place], [place] [phone]   [facility] [address], [place], [place]  [phone]   [facility] [address],  [place], [place] [phone] or [phone]   Substance Abuse Resources Organization         Address  Phone  Notes  [facility]  [phone]   [facility]  [phone]   [facility]  [phone]   [facility]  [phone]   Residential & Outpatient Substance Abuse Program  [phone]
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
## 2013-06-06 NOTE — ED Provider Notes (Signed)
CSN: [id number]     Arrival date & time 06/06/13  1805 History   First MD Initiated Contact with Patient 06/06/13 1833     Chief Complaint  Patient presents with  . Back Pain     (Consider location/radiation/quality/duration/timing/severity/associated sxs/prior Treatment) HPI Comments: Rachel Mcdaniel is a 28 year-old female with a past medical history of low back pain, presenting the Emergency Department with a chief complaint of fall today.  The patient reports she was walking down her stairs at home when she slipped and fell hitting her lower back against a stair.  She denies any other injury or LOC.  She reports pain increases with movement.   The history is provided by the patient and medical records. No language interpreter was used.    Past Medical History  Diagnosis Date  . 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 Tobacco abuse   . Endometriosis   . HTN (hypertension)     Hx PIH w/ preg; now borderline - no meds  . Anemia     history  . Anxiety   . Headache(784.0)     OTC meds prn  . Depression     History - no meds  . Neuromuscular disorder    Past Surgical History  Procedure Laterality Date  . Exploratory laparotomy  11/11/07    LOA, Right ovarian cystectomy  . Diagnostic laparoscopy  05/16/10    Convert Exp Laparotomy, LOA, Fulguration of endometriosis, Right oophorectomy  . Svd  2010    x 1  . Laparoscopic hysterectomy  05/19/2011    Procedure: HYSTERECTOMY TOTAL LAPAROSCOPIC;  Surgeon: Tereso Newcomer, MD;  Location: [facility];  Service: Gynecology;  Laterality: N/A;  . Salpingoophorectomy  05/19/2011    Procedure: SALPINGO OOPHERECTOMY;  Surgeon: Tereso Newcomer, MD;  Location: [facility];  Service: Gynecology;  Laterality: Bilateral;  . Cystoscopy  05/19/2011    Procedure: CYSTOSCOPY;  Surgeon: Tereso Newcomer, MD;  Location: [facility];  Service: Gynecology;  Laterality: N/A;  . Abdominal hysterectomy    . Cesarean section     Family History  Problem Relation Age of  Onset  . Hypertension Father   . Depression Mother   . Lupus Mother   . Cancer Mother    History  Substance Use Topics  . Smoking status: Former Smoker    Quit date: 09/10/2007  . Smokeless tobacco: Never Used     Comment: down to 1 cig per day  . Alcohol Use: No   OB History   Grav Para Term Preterm Abortions TAB SAB Ect Mult Living   1 1 1       1      Review of Systems  Constitutional: Negative for fever and chills.  Musculoskeletal: Positive for back pain and gait problem. Negative for joint swelling, neck pain and neck stiffness.  Skin: Negative for wound.  Neurological: Negative for syncope.      Allergies  Review of patient's allergies indicates no known allergies.  Home Medications   Current Outpatient Rx  Name  Route  Sig  Dispense  Refill  . acetaminophen (TYLENOL) 500 MG tablet   Oral   Take 1,500 mg by mouth daily as needed for mild pain.         Marland Kitchen ALPRAZolam (XANAX) 0.5 MG tablet   Oral   Take 0.5 mg by mouth 3 (three) times daily as needed for anxiety.         . naproxen sodium (ANAPROX) 220  MG tablet   Oral   Take 220 mg by mouth daily as needed (for pain).          BP 155/97  Pulse 120  Temp(Src) 98.9 F (37.2 C)  Resp 18  SpO2 98%  LMP 04/15/2011 Physical Exam  Nursing note and vitals reviewed. Constitutional: She is oriented to person, place, and time. She appears well-developed and well-nourished. No distress.  Appears uncomfortable  HENT:  Head: Normocephalic and atraumatic.  Eyes: EOM are normal. Pupils are equal, round, and reactive to light.  Neck: Normal range of motion. Neck supple.  Cardiovascular: Normal rate and regular rhythm.   Pulmonary/Chest: Effort normal and breath sounds normal. No respiratory distress. She has no wheezes. She has no rales.  Abdominal: Soft. There is no tenderness. There is no rebound and no guarding.  Musculoskeletal:       Lumbar back: She exhibits decreased range of motion and bony  tenderness. She exhibits no swelling, no edema and no laceration.       Back:  Mild midline L-spine tenderness to palpation.  Paravertebral tenderness to palpation.  No ecchymosis. No step-offs, crepitus, or deformities noted. Patient is able to move lower extremities.  Decrease ROM secondary to pain and questionable effort.  No decrease in sensation.  Neurological: She is alert and oriented to person, place, and time.  Skin: Skin is warm and dry. She is not diaphoretic.  Psychiatric: She has a normal mood and affect. Her behavior is normal.    ED Course  Procedures (including critical care time) Labs Review Labs Reviewed - No data to display Imaging Review Dg Lumbar Spine Complete  06/06/2013   CLINICAL DATA:  Low back pain following injury  EXAM: LUMBAR SPINE - COMPLETE 4+ VIEW  COMPARISON:  None.  FINDINGS: There is no evidence of lumbar spine fracture. Alignment is normal. Intervertebral disc spaces are maintained.  IMPRESSION: No acute abnormality noted.   Electronically Signed   By: Alcide CleverMark  Lukens M.D.   On: 06/06/2013 19:47    EKG Interpretation   None       MDM   Final diagnoses:  Low back pain  Fall   Pt with a history of low back pain presents after a fall at home.  PE shows mild L-spine tenderness, doubt fracture. No neurological deficits and normal neuro exam.  Patient can walk but states is painful.  No loss of bowel or bladder control.  No concern for cauda equina.  No fever, night sweats, weight loss, h/o cancer, IVDU. Will XR to confirm.   XR without acute abnormalities. EMR shows pt with a history of narcotic abuse.  Pt denies current pain contract and that her husband dispenses her medication for her at this time. RICE protocol and pain medicine indicated and discussed with patient. Return precautions given. Reports understanding and no other concerns at this time.  Patient is stable for discharge at this time.  Meds given in ED:  Medications    HYDROcodone-acetaminophen (NORCO/VICODIN) 5-325 MG per tablet 1 tablet (1 tablet Oral Given 06/06/13 1850)  ketorolac (TORADOL) 30 MG/ML injection 30 mg (30 mg Intramuscular Given 06/06/13 2008)    Discharge Medication List as of 06/06/2013  8:42 PM    START taking these medications   Details  cyclobenzaprine (FLEXERIL) 10 MG tablet Take 1 tablet (10 mg total) by mouth at bedtime., Starting 06/06/2013, Until Discontinued, Print            Clabe SealLauren M Vianca Bracher, PA-C 06/09/13 1303

## 2013-06-06 NOTE — ED Notes (Signed)
Pt. Pointed to muscles on both sides at her butt muscles.  Pt. Stated, its going down my legs.

## 2013-06-06 NOTE — ED Notes (Signed)
Pt reports "that was the most excruciating thing I have ever done" referring to xray of back.  Pt denies any pain relief.

## 2013-06-08 NOTE — ED Provider Notes (Signed)
Medical screening examination/treatment/procedure(s) were performed by non-physician practitioner and as supervising physician I was immediately available for consultation/collaboration.  EKG Interpretation   None        Hurman HornJohn M Tyrion Glaude, MD 06/08/13 [id number]

## 2014-02-20 ENCOUNTER — Encounter (HOSPITAL_COMMUNITY): Payer: Self-pay | Admitting: Emergency Medicine

## 2015-07-06 ENCOUNTER — Telehealth: Payer: Self-pay | Admitting: Obstetrics and Gynecology

## 2015-07-06 NOTE — Telephone Encounter (Signed)
Called but could not leave a message for patient to call back to schedule a new patient doctor referral. °

## 2015-07-11 NOTE — Telephone Encounter (Signed)
Called but could not leave a message due to "phone number is not in service."  Routing referral back to referring office for follow up with patient.

## 2015-08-24 ENCOUNTER — Emergency Department (HOSPITAL_COMMUNITY)
Admission: EM | Admit: 2015-08-24 | Discharge: 2015-08-24 | Disposition: A | Discharge status: Home / Self Care | Payer: Self-pay | Attending: Emergency Medicine | Admitting: Emergency Medicine

## 2015-08-24 ENCOUNTER — Encounter (HOSPITAL_COMMUNITY): Payer: Self-pay | Admitting: *Deleted

## 2015-08-24 ENCOUNTER — Emergency Department (HOSPITAL_COMMUNITY): Payer: No Typology Code available for payment source

## 2015-08-24 DIAGNOSIS — Z87891 Personal history of nicotine dependence: Secondary | ICD-10-CM | POA: Insufficient documentation

## 2015-08-24 DIAGNOSIS — Z862 Personal history of diseases of the blood and blood-forming organs and certain disorders involving the immune mechanism: Secondary | ICD-10-CM | POA: Insufficient documentation

## 2015-08-24 DIAGNOSIS — G8929 Other chronic pain: Secondary | ICD-10-CM | POA: Insufficient documentation

## 2015-08-24 DIAGNOSIS — M25512 Pain in left shoulder: Secondary | ICD-10-CM

## 2015-08-24 DIAGNOSIS — Y998 Other external cause status: Secondary | ICD-10-CM | POA: Insufficient documentation

## 2015-08-24 DIAGNOSIS — F329 Major depressive disorder, single episode, unspecified: Secondary | ICD-10-CM | POA: Insufficient documentation

## 2015-08-24 DIAGNOSIS — S4992XA Unspecified injury of left shoulder and upper arm, initial encounter: Secondary | ICD-10-CM | POA: Insufficient documentation

## 2015-08-24 DIAGNOSIS — S60222A Contusion of left hand, initial encounter: Secondary | ICD-10-CM | POA: Insufficient documentation

## 2015-08-24 DIAGNOSIS — I10 Essential (primary) hypertension: Secondary | ICD-10-CM | POA: Insufficient documentation

## 2015-08-24 DIAGNOSIS — S7002XA Contusion of left hip, initial encounter: Secondary | ICD-10-CM | POA: Insufficient documentation

## 2015-08-24 DIAGNOSIS — Y92410 Unspecified street and highway as the place of occurrence of the external cause: Secondary | ICD-10-CM | POA: Insufficient documentation

## 2015-08-24 DIAGNOSIS — Y9389 Activity, other specified: Secondary | ICD-10-CM | POA: Insufficient documentation

## 2015-08-24 DIAGNOSIS — F419 Anxiety disorder, unspecified: Secondary | ICD-10-CM | POA: Insufficient documentation

## 2015-08-24 MED ORDER — NAPROXEN 500 MG PO TABS: 500.0000 mg | ORAL_TABLET | Freq: Two times a day (BID) | ORAL | Status: DC

## 2015-08-24 MED ORDER — MORPHINE SULFATE (PF) 4 MG/ML IV SOLN
4.0000 mg | Freq: Once | INTRAVENOUS | Status: AC
  Administered 2015-08-24: 4 mg via INTRAVENOUS

## 2015-08-24 MED ORDER — MORPHINE SULFATE (PF) 4 MG/ML IV SOLN
4.0000 mg | Freq: Once | INTRAVENOUS | Status: DC
  Filled 2015-08-24: qty 1

## 2015-08-24 MED ORDER — ONDANSETRON 4 MG PO TBDP
4.0000 mg | ORAL_TABLET | Freq: Once | ORAL | Status: AC
  Administered 2015-08-24: 4 mg via ORAL
  Filled 2015-08-24: qty 1

## 2015-08-24 MED ORDER — CYCLOBENZAPRINE HCL 10 MG PO TABS: 10.0000 mg | ORAL_TABLET | Freq: Three times a day (TID) | ORAL | Status: DC | PRN
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
## 2017-02-19 DIAGNOSIS — T7421XA Adult sexual abuse, confirmed, initial encounter: Secondary | ICD-10-CM

## 2017-02-19 HISTORY — DX: Adult sexual abuse, confirmed, initial encounter: T74.21XA

## 2017-03-16 ENCOUNTER — Other Ambulatory Visit: Payer: Self-pay

## 2017-03-16 ENCOUNTER — Encounter (HOSPITAL_COMMUNITY): Payer: Self-pay

## 2017-03-16 ENCOUNTER — Inpatient Hospital Stay (HOSPITAL_COMMUNITY)
Admission: AD | Admit: 2017-03-16 | Discharge: 2017-03-16 | Disposition: A | Discharge status: Home / Self Care | Payer: Self-pay | Source: Ambulatory Visit | Attending: Obstetrics & Gynecology | Admitting: Obstetrics & Gynecology

## 2017-03-16 DIAGNOSIS — Z90722 Acquired absence of ovaries, bilateral: Secondary | ICD-10-CM | POA: Insufficient documentation

## 2017-03-16 DIAGNOSIS — N739 Female pelvic inflammatory disease, unspecified: Secondary | ICD-10-CM | POA: Insufficient documentation

## 2017-03-16 DIAGNOSIS — Z0441 Encounter for examination and observation following alleged adult rape: Secondary | ICD-10-CM | POA: Insufficient documentation

## 2017-03-16 DIAGNOSIS — Z818 Family history of other mental and behavioral disorders: Secondary | ICD-10-CM | POA: Insufficient documentation

## 2017-03-16 DIAGNOSIS — G8929 Other chronic pain: Secondary | ICD-10-CM | POA: Insufficient documentation

## 2017-03-16 DIAGNOSIS — Z8249 Family history of ischemic heart disease and other diseases of the circulatory system: Secondary | ICD-10-CM | POA: Insufficient documentation

## 2017-03-16 DIAGNOSIS — Z90710 Acquired absence of both cervix and uterus: Secondary | ICD-10-CM | POA: Insufficient documentation

## 2017-03-16 DIAGNOSIS — I10 Essential (primary) hypertension: Secondary | ICD-10-CM | POA: Insufficient documentation

## 2017-03-16 DIAGNOSIS — T7421XA Adult sexual abuse, confirmed, initial encounter: Secondary | ICD-10-CM

## 2017-03-16 DIAGNOSIS — N730 Acute parametritis and pelvic cellulitis: Secondary | ICD-10-CM

## 2017-03-16 DIAGNOSIS — Z87891 Personal history of nicotine dependence: Secondary | ICD-10-CM | POA: Insufficient documentation

## 2017-03-16 DIAGNOSIS — A6004 Herpesviral vulvovaginitis: Secondary | ICD-10-CM | POA: Insufficient documentation

## 2017-03-16 DIAGNOSIS — Z79899 Other long term (current) drug therapy: Secondary | ICD-10-CM | POA: Insufficient documentation

## 2017-03-16 LAB — URINALYSIS, ROUTINE W REFLEX MICROSCOPIC
BILIRUBIN URINE: NEGATIVE
Bacteria, UA: NONE SEEN
Glucose, UA: NEGATIVE mg/dL
Ketones, ur: NEGATIVE mg/dL
Nitrite: NEGATIVE
Protein, ur: 100 mg/dL — AB
SPECIFIC GRAVITY, URINE: 1.027 (ref 1.005–1.030)
pH: 5 (ref 5.0–8.0)

## 2017-03-16 LAB — WET PREP, GENITAL
Sperm: NONE SEEN
TRICH WET PREP: NONE SEEN
YEAST WET PREP: NONE SEEN

## 2017-03-16 LAB — RAPID URINE DRUG SCREEN, HOSP PERFORMED
AMPHETAMINES: POSITIVE — AB
Barbiturates: NOT DETECTED
Benzodiazepines: POSITIVE — AB
Cocaine: POSITIVE — AB
OPIATES: NOT DETECTED
TETRAHYDROCANNABINOL: POSITIVE — AB

## 2017-03-16 LAB — POCT PREGNANCY, URINE: Preg Test, Ur: NEGATIVE

## 2017-03-16 MED ORDER — CEFTRIAXONE SODIUM 250 MG IJ SOLR
250.0000 mg | Freq: Once | INTRAMUSCULAR | Status: AC
  Administered 2017-03-16: 250 mg via INTRAMUSCULAR
  Filled 2017-03-16: qty 250

## 2017-03-16 MED ORDER — AZITHROMYCIN 250 MG PO TABS
1000.0000 mg | ORAL_TABLET | Freq: Once | ORAL | Status: AC
  Administered 2017-03-16: 1000 mg via ORAL
  Filled 2017-03-16: qty 4

## 2017-03-16 MED ORDER — OXYCODONE-ACETAMINOPHEN 5-325 MG PO TABS
2.0000 | ORAL_TABLET | Freq: Once | ORAL | Status: AC
  Administered 2017-03-16: 2 via ORAL
  Filled 2017-03-16: qty 2

## 2017-03-16 MED ORDER — DOXYCYCLINE HYCLATE 100 MG PO CAPS: 100.0000 mg | ORAL_CAPSULE | Freq: Two times a day (BID) | ORAL | 0 refills | Status: AC

## 2017-03-16 MED ORDER — OXYCODONE-ACETAMINOPHEN 5-325 MG PO TABS: 1.0000 | ORAL_TABLET | ORAL | 0 refills | Status: AC | PRN

## 2017-03-16 MED ORDER — LIDOCAINE HCL 2 % EX GEL
1.0000 "application " | Freq: Once | CUTANEOUS | Status: AC
  Administered 2017-03-16: 1 via TOPICAL
  Filled 2017-03-16: qty 5

## 2017-03-16 MED ORDER — ACYCLOVIR 400 MG PO TABS: 400.0000 mg | ORAL_TABLET | Freq: Three times a day (TID) | ORAL | 0 refills | Status: AC

## 2017-03-16 MED ORDER — ONDANSETRON 8 MG PO TBDP
8.0000 mg | ORAL_TABLET | Freq: Once | ORAL | Status: AC
  Administered 2017-03-16: 8 mg via ORAL
  Filled 2017-03-16: qty 1
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
## 2017-03-16 NOTE — Discharge Instructions (Signed)
Genital Herpes Genital herpes is a common sexually transmitted infection (STI) that is caused by a virus. The virus spreads from person to person through sexual contact. Infection can cause itching, blisters, and sores around the genitals or rectum. Symptoms may last several days and then go away This is called an outbreak. However, the virus remains in your body, so you may have more outbreaks in the future. The time between outbreaks varies and can be months or years. Genital herpes affects men and women. It is particularly concerning for pregnant women because the virus can be passed to the baby during delivery and can cause serious problems. Genital herpes is also a concern for people who have a weak disease-fighting (immune) system. What are the causes? This condition is caused by the herpes simplex virus (HSV) type 1 or type 2. The virus may spread through:  Sexual contact with an infected person, including vaginal, anal, and oral sex.  Contact with fluid from a herpes sore.  The skin. This means that you can get herpes from an infected partner even if he or she does not have a visible sore or does not know that he or she is infected. What increases the risk? You are more likely to develop this condition if:  You have sex with many partners.  You do not use latex condoms during sex. What are the signs or symptoms? Most people do not have symptoms (asymptomatic) or have mild symptoms that may be mistaken for other skin problems. Symptoms may include:  Small red bumps near the genitals, rectum, or mouth. These bumps turn into blisters and then turn into sores.  Flu-like symptoms, including:  Fever.  Body aches.  Swollen lymph nodes.  Headache.  Painful urination.  Pain and itching in the genital area or rectal area.  Vaginal discharge.  Tingling or shooting pain in the legs and buttocks. Generally, symptoms are more severe and last longer during the first (primary)  outbreak. Flu-like symptoms are also more common during the primary outbreak. How is this diagnosed? Genital herpes may be diagnosed based on:  A physical exam.  Your medical history.  Blood tests.  A test of a fluid sample (culture) from an open sore. How is this treated? There is no cure for this condition, but treatment with antiviral medicines that are taken by mouth (orally) can do the following:  Speed up healing and relieve symptoms.  Help to reduce the spread of the virus to sexual partners.  Limit the chance of future outbreaks, or make future outbreaks shorter.  Lessen symptoms of future outbreaks. Your health care provider may also recommend pain relief medicines, such as aspirin or ibuprofen. Follow these instructions at home: Sexual activity   Do not have sexual contact during active outbreaks.  Practice safe sex. Latex condoms and female condoms may help prevent the spread of the herpes virus. General instructions   Keep the affected areas dry and clean.  Take over-the-counter and prescription medicines only as told by your health care provider.  Avoid rubbing or touching blisters and sores. If you do touch blisters or sores:  Wash your hands thoroughly with soap and water.  Do not touch your eyes afterward.  To help relieve pain or itching, you may take the following actions as directed by your health care provider:  Apply a cold, wet cloth (cold compress) to affected areas 4-6 times a day.  Apply a substance that protects your skin and reduces bleeding (astringent).  Apply a   gel that helps relieve pain around sores (lidocaine gel).  Take a warm, shallow bath that cleans the genital area (sitz bath).  Keep all follow-up visits as told by your health care provider. This is important. How is this prevented?  Use condoms. Although anyone can get genital herpes during sexual contact, even with the use of a condom, a condom can provide some  protection.  Avoid having multiple sexual partners.  Talk with your sexual partner about any symptoms either of you may have. Also, talk with your partner about any history of STIs.  Get tested for STIs before you have sex. Ask your partner to do the same.  Do not have sexual contact if you have symptoms of genital herpes. Contact a health care provider if:  Your symptoms are not improving with medicine.  Your symptoms return.  You have new symptoms.  You have a fever.  You have abdominal pain.  You have redness, swelling, or pain in your eye.  You notice new sores on other parts of your body.  You are a woman and experience bleeding between menstrual periods.  You have had herpes and you become pregnant or plan to become pregnant. Summary  Genital herpes is a common sexually transmitted infection (STI) that is caused by the herpes simplex virus (HSV) type 1 or type 2.  These viruses are most often spread through sexual contact with an infected person.  You are more likely to develop this condition if you have sex with many partners or you have unprotected sex.  Most people do not have symptoms (asymptomatic) or have mild symptoms that may be mistaken for other skin problems. Symptoms occur as outbreaks that may happen months or years apart.  There is no cure for this condition, but treatment with oral antiviral medicines can reduce symptoms, reduce the chance of spreading the virus to a partner, prevent future outbreaks, or shorten future outbreaks. This information is not intended to replace advice given to you by your health care provider. Make sure you discuss any questions you have with your health care provider. Document Released: 04/04/2000 Document Revised: 03/07/2016 Document Reviewed: 03/07/2016 [employer] Interactive Patient Education  2017 [employer]  

## 2017-03-17 LAB — HIV ANTIBODY (ROUTINE TESTING W REFLEX): HIV Screen 4th Generation wRfx: NONREACTIVE

## 2017-03-17 LAB — RPR: RPR: NONREACTIVE

## 2017-03-17 LAB — GC/CHLAMYDIA PROBE AMP (~~LOC~~) NOT AT ARMC
Chlamydia: NEGATIVE
Neisseria Gonorrhea: NEGATIVE

## 2017-03-18 LAB — HSV CULTURE AND TYPING

## 2017-04-08 ENCOUNTER — Encounter: Payer: Self-pay | Admitting: Student

## 2017-04-09 ENCOUNTER — Ambulatory Visit: Payer: Self-pay | Admitting: Student

## 2017-04-09 ENCOUNTER — Telehealth: Payer: Self-pay

## 2017-04-09 ENCOUNTER — Institutional Professional Consult (permissible substitution): Payer: Self-pay

## 2017-04-09 ENCOUNTER — Encounter: Payer: Self-pay | Admitting: Student

## 2017-04-09 NOTE — Telephone Encounter (Signed)
LM for pt to return call to the office to reschedule her appt.

## 2017-04-09 NOTE — BH Specialist Note (Deleted)
Integrated Behavioral Health Initial Visit  MRN: [id number] Name: Gershon CullBrittany L Manalo  Number of Integrated Behavioral Health Clinician visits:: 1/6 Session Start time: ***  Session End time: *** Total time: {IBH Total Time:21014050}  Type of Service: Integrated Behavioral Health- Individual/Family Interpretor:No. Interpretor Name and Language: n/a   Warm Hand Off Completed.       SUBJECTIVE: Gershon CullBrittany L Barthel is a 31 y.o. female accompanied by *** Patient was referred by Judeth HornErin Lawrence, NP for hx of behavioral health issues Patient reports the following symptoms/concerns: *** Duration of problem: ***; Severity of problem: {Mild/Moderate/Severe:20260}  OBJECTIVE:*** Mood: {BHH MOOD:22306} and Affect: {BHH AFFECT:22307} Risk of harm to self or others: {CHL AMB BH Suicide Current Mental Status:21022748}  LIFE CONTEXT: Family and Social: *** School/Work: *** Self-Care: *** Life Changes: ***  GOALS ADDRESSED: Patient will: 1. Reduce symptoms of: {IBH Symptoms:21014056} 2. Increase knowledge and/or ability of: {IBH Patient Tools:21014057}  3. Demonstrate ability to: {IBH Goals:21014053}  INTERVENTIONS: Interventions utilized: {IBH Interventions:21014054}  Standardized Assessments completed: GAD-7 and PHQ 9  ASSESSMENT: Patient currently experiencing ***.   Patient may benefit from psychoeducation and brief therapeutic interventions regarding coping with symptoms of *** .  PLAN: 1. Follow up with behavioral health clinician on : *** 2. Behavioral recommendations:  -*** -*** -Read educational materials regarding coping with symptoms of *** 3. Referral(s): {IBH Referrals:21014055} 4. "From scale of 1-10, how likely are you to follow plan?": ***  Dennette Faulconer C Aleana Fifita, LCSW  ***
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
